# Patient Record
Sex: Female | Born: 1985 | Race: White | Hispanic: No | Marital: Single | Smoking: Smoker, current status unknown
Health system: Southern US, Community
[De-identification: ages and names within clinical notes are randomized; demographics above are authoritative.]

## PROBLEM LIST (undated history)

## (undated) DIAGNOSIS — F329 Major depressive disorder, single episode, unspecified: Secondary | ICD-10-CM

## (undated) DIAGNOSIS — R32 Unspecified urinary incontinence: Secondary | ICD-10-CM

## (undated) DIAGNOSIS — R200 Anesthesia of skin: Secondary | ICD-10-CM

## (undated) DIAGNOSIS — R202 Paresthesia of skin: Secondary | ICD-10-CM

## (undated) DIAGNOSIS — H9319 Tinnitus, unspecified ear: Secondary | ICD-10-CM

## (undated) DIAGNOSIS — G43909 Migraine, unspecified, not intractable, without status migrainosus: Secondary | ICD-10-CM

## (undated) DIAGNOSIS — F32A Depression, unspecified: Secondary | ICD-10-CM

## (undated) DIAGNOSIS — M5440 Lumbago with sciatica, unspecified side: Secondary | ICD-10-CM

## (undated) DIAGNOSIS — F419 Anxiety disorder, unspecified: Secondary | ICD-10-CM

## (undated) HISTORY — PX: BACK SURGERY: SHX140

## (undated) HISTORY — PX: WISDOM TOOTH EXTRACTION: SHX21

## (undated) HISTORY — PX: DILATION AND CURETTAGE OF UTERUS: SHX78

---
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
## 2003-07-20 ENCOUNTER — Emergency Department (HOSPITAL_COMMUNITY): Admission: EM | Admit: 2003-07-20 | Discharge: 2003-07-20 | Payer: Self-pay | Admitting: Emergency Medicine

## 2004-01-12 ENCOUNTER — Inpatient Hospital Stay (HOSPITAL_COMMUNITY): Admission: AD | Admit: 2004-01-12 | Discharge: 2004-01-12 | Payer: Self-pay | Admitting: Family Medicine

## 2004-05-14 ENCOUNTER — Emergency Department (HOSPITAL_COMMUNITY): Admission: EM | Admit: 2004-05-14 | Discharge: 2004-05-14 | Payer: Self-pay | Admitting: *Deleted

## 2005-02-07 ENCOUNTER — Other Ambulatory Visit: Admission: RE | Admit: 2005-02-07 | Discharge: 2005-02-07 | Payer: Self-pay | Admitting: Obstetrics and Gynecology

## 2005-02-23 ENCOUNTER — Ambulatory Visit (HOSPITAL_COMMUNITY): Admission: RE | Admit: 2005-02-23 | Discharge: 2005-02-23 | Payer: Self-pay | Admitting: Obstetrics and Gynecology

## 2005-02-23 ENCOUNTER — Encounter (INDEPENDENT_AMBULATORY_CARE_PROVIDER_SITE_OTHER): Payer: Self-pay | Admitting: *Deleted

## 2007-02-25 ENCOUNTER — Emergency Department (HOSPITAL_COMMUNITY): Admission: EM | Admit: 2007-02-25 | Discharge: 2007-02-26 | Payer: Self-pay | Admitting: Emergency Medicine

## 2007-10-03 ENCOUNTER — Inpatient Hospital Stay (HOSPITAL_COMMUNITY): Admission: AD | Admit: 2007-10-03 | Discharge: 2007-10-05 | Payer: Self-pay | Admitting: Obstetrics and Gynecology

## 2008-09-24 ENCOUNTER — Inpatient Hospital Stay (HOSPITAL_COMMUNITY): Admission: AD | Admit: 2008-09-24 | Discharge: 2008-09-26 | Payer: Self-pay | Admitting: Obstetrics and Gynecology

## 2011-01-02 LAB — CBC
HCT: 32.7 % — ABNORMAL LOW (ref 36.0–46.0)
HCT: 39.5 % (ref 36.0–46.0)
Hemoglobin: 13.2 g/dL (ref 12.0–15.0)
MCHC: 33.4 g/dL (ref 30.0–36.0)
MCV: 88.6 fL (ref 78.0–100.0)
MCV: 88.7 fL (ref 78.0–100.0)
Platelets: 170 10*3/uL (ref 150–400)
Platelets: 225 10*3/uL (ref 150–400)
RBC: 4.46 MIL/uL (ref 3.87–5.11)
RDW: 13.4 % (ref 11.5–15.5)
RDW: 13.7 % (ref 11.5–15.5)
WBC: 16 10*3/uL — ABNORMAL HIGH (ref 4.0–10.5)

## 2011-01-31 NOTE — Discharge Summary (Signed)
Rhonda Key, DIECKMAN              ACCOUNT NO.:  [id number]   MEDICAL RECORD NO.:  [id number]          PATIENT TYPE:  INP   LOCATION:  9107                          FACILITY:  [facility]   PHYSICIAN:  Malachi Pro. Ambrose Mantle, M.D. DATE OF BIRTH:  Jun 11, 1986   DATE OF ADMISSION:  09/24/2008  DATE OF DISCHARGE:  09/26/2008                               DISCHARGE SUMMARY   A 25 year old white female para 1-0-2-1, gravida 4, EDC September 27, 2008, by 10-week ultrasound presenting with contractions every 2-4  minutes and cervix changed to 5 cm.  Prenatal care was relatively  uncomplicated except for positive group B strep and a history of stable  depression, possibly desired tubal ligation.   PRENATAL LABORATORIES:  O positive.  Negative antibody.  RPR  nonreactive.  Rubella immune.  Hepatitis B surface antigen negative.  HIV negative.  GC and chlamydia negative.  Group B strep positive.  One-  hour Glucola 108.  Quad screen negative.   PAST OBSTETRICAL HISTORY:  In 2006, she had a spontaneous abortion.  Also in 2006, an early abortion.  In January 2009, an 8 pounds 5 ounce  vaginal delivery.   GYN HISTORY:  No abnormal Paps.   MEDICAL HISTORY:  History of depression.   SURGICAL HISTORY:  D&E.   ALLERGIES:  None.   MEDICATIONS:  Prenatal vitamins.   PHYSICAL EXAMINATION:  On admission she was afebrile with normal vital  signs.  Heart and lungs were normal.  The abdomen was soft, gravid,  nontender.  Cervix was 4-5 cm, 80% vertex at 0 station.  Artificial  rupture of membranes produced meconium-stained fluid.  The patient was  felt to be in the latent phase of labor and intrauterine pressure  catheter was placed and with augmentation with Pitocin.  No cervical  change in some hours.  The patient was on penicillin for the positive  group B strep status and she was comfortable.  At 4:50 p.m., she had  rapidly progressed to complete dilatation and pushed great with a normal  spontaneous delivery  of vigorous female infant weight 7 pounds 12  ounces, Apgars of 8 and 9 one and five minutes.  Dr. Senaida Ores was in  attendance.  Placenta was delivered spontaneously.  Three-vessel cord  was noted.  Cervix, rectum, and vagina were intact.  Blood loss about  350 mL.  Dr. Senaida Ores had a long discussion with the patient regarding  postpartum tubal ligation as a permanent procedure.  She discussed with  her the options of Mirena, IUD, etc.  She declined postpartum tubal and  would use the Mirena at postpartum visit.  On the first and second  postpartum days, the patient was doing well and on the second postpartum  day was considered a candidate for discharge.  RPR was nonreactive.  Initial hemoglobin 13.2, hematocrit 39.5, white count 16,000, platelet  count 225,000.  Followup hemoglobin 11.1.   FINAL DIAGNOSIS:  Intrauterine pregnancy at 39+ weeks, delivered vertex.   OPERATION:  Spontaneous delivery, vertex.   FINAL CONDITION:  Improved.   INSTRUCTIONS:  Our regular discharge instruction booklet.  Motrin  600 mg  30 tablets 1 every 6 hours as needed for pain given at discharge.      Malachi Pro. Ambrose Mantle, M.D.  Electronically Signed     TFH/MEDQ  D:  09/26/2008  T:  09/26/2008  Job:  [id number]

## 2011-01-31 NOTE — Discharge Summary (Signed)
NAMEARITZA, Rhonda Key              ACCOUNT NO.:  [id number]   MEDICAL RECORD NO.:  [id number]          PATIENT TYPE:  INP   LOCATION:  9119                          FACILITY:  [facility]   PHYSICIAN:  Huel Cote, M.D. DATE OF BIRTH:  [birth year]   DATE OF ADMISSION:  10/03/2007  DATE OF DISCHARGE:  10/05/2007                               DISCHARGE SUMMARY   DISCHARGE DIAGNOSES:  1. Term pregnancy at 39 weeks, delivered.  2. Status post normal spontaneous vaginal delivery.   DISCHARGE MEDICATIONS:  1. Motrin 600 mg p.o. every 6 hours.  2. Percocet 1-2 tablets p.o. every 4 hours p.r.n.   FOLLOW UP:  The patient is to follow up in 6 weeks for her routine  postpartum exam.   HOSPITAL COURSE:  The patient is a 25 year old G3, P0-0-2-0, who was  admitted at 39+ weeks gestation in labor.  Prenatal care had been  complicated by late start and other than that was uneventful.   PAST MEDICAL HISTORY:  None.   PAST SURGICAL HISTORY:  D&C in 2006.   PAST OBSTETRICAL HISTORY:  The patient had a spontaneous miscarriage in  2006 and elective abortion in 2006.   PAST GYNECOLOGICAL HISTORY:  None.   ALLERGIES:  NONE.   MEDICATIONS:  She is only on prenatal vitamins on admission.  She did  have a history of depression, however, was stable off of medications  throughout her pregnancy.   LABORATORY DATA:  Prenatal labs:  O+ antibody negative, RPR nonreactive,  rubella immune, hepatitis B surface antigen negative, HIV negative, GC  negative, chlamydia negative, 1 hour Glucola 124, group B strep  negative.   PHYSICAL EXAMINATION:  VITAL SIGNS:  All within normal limits on  admission.  Fetal heart rate was reactive.  GU:  She had a cervical exam that was 4 cm AV and -2 station.   HOSPITAL COURSE:  She had rupture of membranes performed by Dr. Ambrose Mantle.  No specific fluid was seen.  She continued to progress and reach  complete dilation, pushed well and had a vaginal delivery of a viable  female infant.  Apgars were 8 and 9.  Weight was 8 pounds 5 ounces.  There was some meconium stained fluid which was noted after delivery of  the infant.  She had some lacerations which were repaired with 2-0 and 3-  0 Vicryl, second-degree  left and a right labial laceration.  Estimated blood loss was 500 cc.  She was then admitted for routine postpartum care and did quite well.  On postpartum day number 2, she was tolerating her pain with p.o.  medications and was doing quite well, and was felt stable for discharge  home.      Huel Cote, M.D.  Electronically Signed     KR/MEDQ  D:  10/05/2007  T:  10/05/2007  Job:  [id number]

## 2011-02-03 NOTE — Op Note (Signed)
[name], Rhonda Key              ACCOUNT NO.:  [id number]   MEDICAL RECORD NO.:  [id number]          PATIENT TYPE:  AMB   LOCATION:  SDC                           FACILITY:  [facility]   PHYSICIAN:  Carrington Clamp, M.D. DATE OF BIRTH:  1985/10/04   DATE OF PROCEDURE:  02/23/2005  DATE OF DISCHARGE:                                 OPERATIVE REPORT   PREOPERATIVE DIAGNOSIS:  Missed abortion.   POSTOPERATIVE DIAGNOSIS:  Missed abortion.   PROCEDURE:  Dilatation and curettage with suction.   SURGEON:  Carrington Clamp, M.D.   ASSISTANT:  None.   ANESTHESIA:  MAC.   SPECIMENS:  Uterine contents.   ESTIMATED BLOOD LOSS:  100 mL.   URINE OUTPUT:  Not measured.   FLUIDS REPLACED:   COMPLICATIONS:  None.   FINDINGS:  8 week uterus sounded to 6 weeks size post procedure with good  crie.   MEDICATIONS:  1% lidocaine with epinephrine.  Methergine.   DESCRIPTION OF PROCEDURE:  After adequate MAC anesthesia was achieved, the  patient was prepped and draped in the usual sterile fashion in the dorsal  lithotomy position.  Speculum was placed in the vagina after the bladder had  been drained with a red rubber catheter.  CO2 tenaculum used to stabilize  the cervix.  The cervix was dilated up with [facility] dilators until 9 mm curet  could be passed into the uterine cavity.  Tissue was noted on curettage  and alternating sharp curettage and suction curettage emptied the uterus of  its contents.  Good crie was noted post procedure and the patient was given  Methergine with 1% lidocaine with epinephrine directly to the cervix.  The  patient tolerated the procedure well and returned to the recovery room in  stable condition.       MH/MEDQ  D:  02/23/2005  T:  02/23/2005  Job:  [id number]

## 2011-06-08 LAB — RPR: RPR Ser Ql: NONREACTIVE

## 2011-06-08 LAB — CBC
Platelets: 208
Platelets: 242
RDW: 13.8
WBC: 16 — ABNORMAL HIGH
WBC: 17.2 — ABNORMAL HIGH

## 2011-06-08 LAB — CCBB MATERNAL DONOR DRAW

## 2011-07-06 LAB — POCT PREGNANCY, URINE
Operator id: 27065
Preg Test, Ur: POSITIVE

## 2011-07-06 LAB — URINE MICROSCOPIC-ADD ON

## 2011-07-06 LAB — GC/CHLAMYDIA PROBE AMP, GENITAL
Chlamydia, DNA Probe: NEGATIVE
GC Probe Amp, Genital: NEGATIVE

## 2011-07-06 LAB — URINALYSIS, ROUTINE W REFLEX MICROSCOPIC
Nitrite: NEGATIVE
Protein, ur: NEGATIVE
Urobilinogen, UA: 1

## 2011-09-05 ENCOUNTER — Encounter: Payer: Self-pay | Admitting: *Deleted

## 2011-09-05 ENCOUNTER — Emergency Department (HOSPITAL_COMMUNITY)
Admission: EM | Admit: 2011-09-05 | Discharge: 2011-09-06 | Disposition: A | Discharge status: Home / Self Care | Payer: Self-pay | Attending: Emergency Medicine | Admitting: Emergency Medicine

## 2011-09-05 DIAGNOSIS — R110 Nausea: Secondary | ICD-10-CM | POA: Insufficient documentation

## 2011-09-05 DIAGNOSIS — N949 Unspecified condition associated with female genital organs and menstrual cycle: Secondary | ICD-10-CM | POA: Insufficient documentation

## 2011-09-05 DIAGNOSIS — R5381 Other malaise: Secondary | ICD-10-CM | POA: Insufficient documentation

## 2011-09-05 DIAGNOSIS — N938 Other specified abnormal uterine and vaginal bleeding: Secondary | ICD-10-CM | POA: Insufficient documentation

## 2011-09-05 DIAGNOSIS — F17200 Nicotine dependence, unspecified, uncomplicated: Secondary | ICD-10-CM | POA: Insufficient documentation

## 2011-09-05 DIAGNOSIS — G43909 Migraine, unspecified, not intractable, without status migrainosus: Secondary | ICD-10-CM | POA: Insufficient documentation

## 2011-09-05 DIAGNOSIS — R109 Unspecified abdominal pain: Secondary | ICD-10-CM | POA: Insufficient documentation

## 2011-09-05 DIAGNOSIS — R630 Anorexia: Secondary | ICD-10-CM | POA: Insufficient documentation

## 2011-09-05 LAB — URINALYSIS, ROUTINE W REFLEX MICROSCOPIC
Bilirubin Urine: NEGATIVE
Ketones, ur: NEGATIVE mg/dL
Nitrite: NEGATIVE
Specific Gravity, Urine: 1.014 (ref 1.005–1.030)
Urobilinogen, UA: 0.2 mg/dL (ref 0.0–1.0)

## 2011-09-05 LAB — URINE MICROSCOPIC-ADD ON

## 2011-09-05 NOTE — ED Notes (Signed)
Pt in c/o vaginal bleeding x3 weeks, states she has an IUD and does not normally have a period, also c/o headaches over last year- also abd pain x3 weeks

## 2011-09-05 NOTE — ED Provider Notes (Signed)
History     CSN: [id number] Arrival date & time: 09/05/2011  6:40 PM   First MD Initiated Contact with Patient 09/05/11 2300      Chief Complaint  Patient presents with  . Vaginal Bleeding  . Headache    (Consider location/radiation/quality/duration/timing/severity/associated sxs/prior treatment) HPI Comments: Patient has an IUD in started having vaginal bleeding 3 weeks ago which has gradually worsened to where now she is going through multiple pads and soaking through her clothing. Also gradually worsening pain in the vaginal and lower abdominal area which is worse with sitting and going to the bathroom. It improves with lying down. She denies any shortness of breath, chest pain but states she does feel weak.  Patient is a 25 y.o. female presenting with vaginal bleeding and headaches. The history is provided by the patient.  Vaginal Bleeding This is a new problem. Episode onset: 3 weeks ago. The problem occurs constantly. The problem has been gradually worsening. Associated symptoms include abdominal pain and headaches. Pertinent negatives include no chest pain and no shortness of breath. The symptoms are aggravated by nothing. The symptoms are relieved by nothing. She has tried acetaminophen for the symptoms. The treatment provided no relief.  Headache  This is a chronic problem. Episode onset: One year ago. Episode frequency: Everyday. The problem has been gradually improving. The headache is associated with bright light, activity and emotional stress. The pain is located in the frontal region. The quality of the pain is described as throbbing and dull. The pain is at a severity of 3/10. The pain is mild. The pain does not radiate. Associated symptoms include anorexia and nausea. Pertinent negatives include no fever, no chest pressure, no palpitations, no shortness of breath and no vomiting. She has tried acetaminophen for the symptoms. The treatment provided mild relief.    History  reviewed. No pertinent past medical history.  History reviewed. No pertinent past surgical history.  History reviewed. No pertinent family history.  History  Substance Use Topics  . Smoking status: Current Everyday Smoker  . Smokeless tobacco: Not on file  . Alcohol Use: No    OB History    Grav Para Term Preterm Abortions TAB SAB Ect Mult Living                  Review of Systems  Constitutional: Negative for fever and chills.  Respiratory: Negative for shortness of breath.   Cardiovascular: Negative for chest pain and palpitations.  Gastrointestinal: Positive for nausea, abdominal pain and anorexia. Negative for vomiting.  Genitourinary: Positive for vaginal bleeding.  Neurological: Positive for weakness and headaches. Negative for syncope, speech difficulty and numbness.  All other systems reviewed and are negative.    Allergies  Sulfa antibiotics  Home Medications   Current Outpatient Rx  Name Route Sig Dispense Refill  . ACETAMINOPHEN 500 MG PO TABS Oral Take 1,000 mg by mouth every 4 (four) hours as needed. For pain.       BP 107/64  Pulse 70  Temp(Src) 98.6 F (37 C) (Oral)  Resp 20  SpO2 97%  Physical Exam  Nursing note and vitals reviewed. Constitutional: She is oriented to person, place, and time. She appears well-developed and well-nourished. No distress.  HENT:  Head: Normocephalic and atraumatic.  Mouth/Throat: Oropharynx is clear and moist.  Eyes: Conjunctivae and EOM are normal. Pupils are equal, round, and reactive to light.       Pink conjunctiva  Neck: Normal range of motion. Neck supple.  Cardiovascular: Normal rate, regular rhythm, normal heart sounds and intact distal pulses.  Exam reveals no friction rub.   No murmur heard. Pulmonary/Chest: Effort normal and breath sounds normal. She has no wheezes. She has no rales.  Abdominal: Soft. Bowel sounds are normal. She exhibits no distension. There is no tenderness. There is no rebound and no  guarding.  Genitourinary: Vagina normal and uterus normal. Cervix exhibits no motion tenderness. Right adnexum displays no mass and no tenderness. Left adnexum displays no mass and no tenderness. No tenderness around the vagina. No vaginal discharge found.       Mild vaginal bleeding. The strings of IUD are present.  Musculoskeletal: Normal range of motion. She exhibits no tenderness.       No edema  Lymphadenopathy:    She has no cervical adenopathy.  Neurological: She is alert and oriented to person, place, and time. She has normal strength. No cranial nerve deficit or sensory deficit.  Skin: Skin is warm and dry. No rash noted.  Psychiatric: She has a normal mood and affect. Her behavior is normal.    ED Course  Procedures (including critical care time)  Labs Reviewed  URINALYSIS, ROUTINE W REFLEX MICROSCOPIC - Abnormal; Notable for the following:    Hgb urine dipstick LARGE (*)    All other components within normal limits  WET PREP, GENITAL - Abnormal; Notable for the following:    WBC, Wet Prep HPF POC FEW (*)    All other components within normal limits  URINE MICROSCOPIC-ADD ON  PREGNANCY, URINE  POCT PREGNANCY, URINE  POCT PREGNANCY, URINE  GC/CHLAMYDIA PROBE AMP, GENITAL   No results found.   No diagnosis found.    MDM   Patient with a history of an IUD placed 3 years ago who has had no problems with it until the last 3 weeks. She states while having the IUD she had very minimal bleeding until the last 3 weeks. She has had heavy periods and a lot of pressure and pain in the vaginal area worse with sitting. She feels better when lying down but states the bleeding has persisted and now she is bleeding even heavier going through pads and her clothing. Patient states yesterday she attempted to feel the strings of her IUD and was unable to feel them. She denies any vaginal discharge, new sexual partners, or history of STD. Also she is complaining of mild suprapubic abdominal  pain that is just gradually worsened over the last 3 weeks. No vomiting, fever, diarrhea. Secondly the patient for the last year has had persistent migraines which have become very intense within the last month however in the last 2-3 weeks they've improved since the bleeding started. She has only been taking Tylenol for the migraines and states currently she is not having a headache. She denies any neurologic symptoms or any symptoms suggestive of a space occupying lesion, arachnoid hemorrhage, or other life-threatening pathology.  Will do a pelvic exam to further evaluate patient's initial complaints.  1:19 AM Wet prep negative for signs of infection. UA without signs of infection. UPT negative. Went back to see if patient wanted her IUD out however she states she has to leave to get  her children she says she'll get it worked out when her insurance kicks back in. Will give Provera to see if that improves the bleeding. There were no sign surging for cervical motion tenderness or ovarian abnormalities.      Gwyneth Sprout, MD 09/06/11 [phone]

## 2011-09-06 LAB — WET PREP, GENITAL
Clue Cells Wet Prep HPF POC: NONE SEEN
Trich, Wet Prep: NONE SEEN
Yeast Wet Prep HPF POC: NONE SEEN

## 2011-09-06 LAB — GC/CHLAMYDIA PROBE AMP, GENITAL
Chlamydia, DNA Probe: NEGATIVE
GC Probe Amp, Genital: NEGATIVE

## 2011-09-06 LAB — POCT PREGNANCY, URINE: Preg Test, Ur: NEGATIVE

## 2011-09-06 LAB — PREGNANCY, URINE: Preg Test, Ur: NEGATIVE

## 2011-09-06 MED ORDER — MEDROXYPROGESTERONE ACETATE 5 MG PO TABS: 5.0000 mg | ORAL_TABLET | Freq: Every day | ORAL | Status: DC

## 2011-09-06 NOTE — ED Notes (Signed)
MD at bedside. 

## 2012-10-02 ENCOUNTER — Encounter (HOSPITAL_COMMUNITY): Payer: Self-pay | Admitting: Emergency Medicine

## 2012-10-02 ENCOUNTER — Emergency Department (HOSPITAL_COMMUNITY)
Admission: EM | Admit: 2012-10-02 | Discharge: 2012-10-02 | Disposition: A | Discharge status: Home / Self Care | Payer: Self-pay | Attending: Emergency Medicine | Admitting: Emergency Medicine

## 2012-10-02 DIAGNOSIS — Z79899 Other long term (current) drug therapy: Secondary | ICD-10-CM | POA: Insufficient documentation

## 2012-10-02 DIAGNOSIS — M5430 Sciatica, unspecified side: Secondary | ICD-10-CM | POA: Insufficient documentation

## 2012-10-02 DIAGNOSIS — M545 Low back pain, unspecified: Secondary | ICD-10-CM | POA: Insufficient documentation

## 2012-10-02 DIAGNOSIS — F17200 Nicotine dependence, unspecified, uncomplicated: Secondary | ICD-10-CM | POA: Insufficient documentation

## 2012-10-02 DIAGNOSIS — M79609 Pain in unspecified limb: Secondary | ICD-10-CM | POA: Insufficient documentation

## 2012-10-02 DIAGNOSIS — R52 Pain, unspecified: Secondary | ICD-10-CM | POA: Insufficient documentation

## 2012-10-02 DIAGNOSIS — M5441 Lumbago with sciatica, right side: Secondary | ICD-10-CM

## 2012-10-02 HISTORY — DX: Lumbago with sciatica, unspecified side: M54.40

## 2012-10-02 MED ORDER — PREDNISONE 10 MG PO TABS: 20.0000 mg | ORAL_TABLET | Freq: Every day | ORAL | Status: DC

## 2012-10-02 MED ORDER — METHOCARBAMOL 500 MG PO TABS
500.0000 mg | ORAL_TABLET | Freq: Once | ORAL | Status: AC
  Administered 2012-10-02: 500 mg via ORAL
  Filled 2012-10-02: qty 1

## 2012-10-02 MED ORDER — METHOCARBAMOL 500 MG PO TABS: 500.0000 mg | ORAL_TABLET | Freq: Two times a day (BID) | ORAL | Status: DC

## 2012-10-02 MED ORDER — HYDROCODONE-ACETAMINOPHEN 5-325 MG PO TABS: 1.0000 | ORAL_TABLET | ORAL | Status: DC | PRN

## 2012-10-02 MED ORDER — HYDROCODONE-ACETAMINOPHEN 5-325 MG PO TABS
1.0000 | ORAL_TABLET | Freq: Once | ORAL | Status: AC
  Administered 2012-10-02: 1 via ORAL
  Filled 2012-10-02: qty 1

## 2012-10-02 NOTE — ED Provider Notes (Signed)
Medical screening examination/treatment/procedure(s) were performed by non-physician practitioner and as supervising physician I was immediately available for consultation/collaboration.   Glynn Octave, MD 10/02/12 [phone]
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
## 2012-10-16 ENCOUNTER — Emergency Department (HOSPITAL_COMMUNITY)
Admission: EM | Admit: 2012-10-16 | Discharge: 2012-10-16 | Disposition: A | Discharge status: Home / Self Care | Payer: Self-pay | Attending: Emergency Medicine | Admitting: Emergency Medicine

## 2012-10-16 ENCOUNTER — Encounter (HOSPITAL_COMMUNITY): Payer: Self-pay

## 2012-10-16 ENCOUNTER — Emergency Department (HOSPITAL_COMMUNITY): Payer: Self-pay

## 2012-10-16 DIAGNOSIS — Z8739 Personal history of other diseases of the musculoskeletal system and connective tissue: Secondary | ICD-10-CM | POA: Insufficient documentation

## 2012-10-16 DIAGNOSIS — IMO0002 Reserved for concepts with insufficient information to code with codable children: Secondary | ICD-10-CM | POA: Insufficient documentation

## 2012-10-16 DIAGNOSIS — M5416 Radiculopathy, lumbar region: Secondary | ICD-10-CM

## 2012-10-16 DIAGNOSIS — F17200 Nicotine dependence, unspecified, uncomplicated: Secondary | ICD-10-CM | POA: Insufficient documentation

## 2012-10-16 DIAGNOSIS — M5126 Other intervertebral disc displacement, lumbar region: Secondary | ICD-10-CM | POA: Insufficient documentation

## 2012-10-16 MED ORDER — OXYCODONE-ACETAMINOPHEN 5-325 MG PO TABS
1.0000 | ORAL_TABLET | Freq: Once | ORAL | Status: AC
  Administered 2012-10-16: 1 via ORAL
  Filled 2012-10-16: qty 1

## 2012-10-16 MED ORDER — PREDNISONE 10 MG PO TABS: 60.0000 mg | ORAL_TABLET | Freq: Once | ORAL | Status: DC

## 2012-10-16 MED ORDER — OXYCODONE-ACETAMINOPHEN 5-325 MG PO TABS: 1.0000 | ORAL_TABLET | ORAL | Status: DC | PRN
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
## 2012-10-16 NOTE — ED Notes (Signed)
Patient transported to MRI 

## 2012-10-16 NOTE — ED Notes (Addendum)
Patients urine collected by me.  Patients urine labeled by emt.liza and placed by the bedside.  rn-ericka notifed of urine collection.  Patient checked for confirmation of name and mrn number.

## 2012-10-16 NOTE — ED Notes (Signed)
Patient provided urine sample if needed.

## 2012-10-16 NOTE — ED Notes (Signed)
Pt is concerned about back pain and has been unable to sleep, sit down for more than 5 mins, and walk without extreme pain. Pt stated that pain is constant. Also c/o pelvic pain that feels like its in the bone and tingling to right foot.
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
## 2012-10-20 ENCOUNTER — Emergency Department (HOSPITAL_COMMUNITY)
Admission: EM | Admit: 2012-10-20 | Discharge: 2012-10-20 | Disposition: A | Discharge status: Home / Self Care | Payer: Self-pay | Attending: Emergency Medicine | Admitting: Emergency Medicine

## 2012-10-20 ENCOUNTER — Encounter (HOSPITAL_COMMUNITY): Payer: Self-pay | Admitting: *Deleted

## 2012-10-20 DIAGNOSIS — G8929 Other chronic pain: Secondary | ICD-10-CM | POA: Insufficient documentation

## 2012-10-20 DIAGNOSIS — IMO0002 Reserved for concepts with insufficient information to code with codable children: Secondary | ICD-10-CM | POA: Insufficient documentation

## 2012-10-20 DIAGNOSIS — M5417 Radiculopathy, lumbosacral region: Secondary | ICD-10-CM

## 2012-10-20 DIAGNOSIS — Z791 Long term (current) use of non-steroidal anti-inflammatories (NSAID): Secondary | ICD-10-CM | POA: Insufficient documentation

## 2012-10-20 DIAGNOSIS — F17200 Nicotine dependence, unspecified, uncomplicated: Secondary | ICD-10-CM | POA: Insufficient documentation

## 2012-10-20 DIAGNOSIS — M5431 Sciatica, right side: Secondary | ICD-10-CM

## 2012-10-20 DIAGNOSIS — M5430 Sciatica, unspecified side: Secondary | ICD-10-CM | POA: Insufficient documentation

## 2012-10-20 DIAGNOSIS — Z79899 Other long term (current) drug therapy: Secondary | ICD-10-CM | POA: Insufficient documentation

## 2012-10-20 LAB — BASIC METABOLIC PANEL
BUN: 18 mg/dL (ref 6–23)
Chloride: 99 mEq/L (ref 96–112)
GFR calc Af Amer: >90 mL/min (ref >90)
GFR calc non Af Amer: >90 mL/min (ref >90)
Potassium: 3.8 mEq/L (ref 3.5–5.1)

## 2012-10-20 LAB — CBC WITH DIFFERENTIAL/PLATELET
Basophils Absolute: 0 10*3/uL (ref 0.0–0.1)
Basophils Relative: 0 % (ref 0–1)
Eosinophils Absolute: 0 10*3/uL (ref 0.0–0.7)
Hemoglobin: 14.6 g/dL (ref 12.0–15.0)
MCH: 30.5 pg (ref 26.0–34.0)
MCHC: 34.5 g/dL (ref 30.0–36.0)
Monocytes Relative: 4 % (ref 3–12)
Neutro Abs: 10.8 10*3/uL — ABNORMAL HIGH (ref 1.7–7.7)
Neutrophils Relative %: 88 % — ABNORMAL HIGH (ref 43–77)
Platelets: 245 10*3/uL (ref 150–400)
RDW: 13.1 % (ref 11.5–15.5)

## 2012-10-20 LAB — POCT PREGNANCY, URINE: Preg Test, Ur: NEGATIVE

## 2012-10-20 MED ORDER — HYDROMORPHONE HCL PF 1 MG/ML IJ SOLN
1.0000 mg | Freq: Once | INTRAMUSCULAR | Status: AC
  Administered 2012-10-20: 1 mg via INTRAVENOUS
  Filled 2012-10-20: qty 1

## 2012-10-20 MED ORDER — METHOCARBAMOL 750 MG PO TABS: 750.0000 mg | ORAL_TABLET | Freq: Four times a day (QID) | ORAL | Status: DC

## 2012-10-20 MED ORDER — HYDROMORPHONE HCL 2 MG PO TABS: 2.0000 mg | ORAL_TABLET | ORAL | Status: DC | PRN

## 2012-10-20 MED ORDER — METHOCARBAMOL 100 MG/ML IJ SOLN: 1000.0000 mg | Freq: Once | INTRAMUSCULAR | Status: DC

## 2012-10-20 MED ORDER — ONDANSETRON HCL 4 MG/2ML IJ SOLN
4.0000 mg | Freq: Once | INTRAMUSCULAR | Status: AC
  Administered 2012-10-20: 4 mg via INTRAVENOUS
  Filled 2012-10-20: qty 2

## 2012-10-20 MED ORDER — METHOCARBAMOL 100 MG/ML IJ SOLN
1000.0000 mg | Freq: Once | INTRAVENOUS | Status: AC
  Administered 2012-10-20: 1000 mg via INTRAVENOUS
  Filled 2012-10-20 (×2): qty 10
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
## 2012-10-20 NOTE — ED Notes (Signed)
Iv started pain and nausea med given.  Robaxin 1 gm hung to run in over one hour

## 2012-10-20 NOTE — ED Notes (Signed)
The pt has been crying out almost since she arrived.  She has no tears in her eyes
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
## 2012-10-20 NOTE — ED Provider Notes (Signed)
10:38 AM  Patient signed out to me by Dr. Preston Fleeting. Patient able to ambulate at this point. She will followup with the neurosurgeon tomorrow  Toy Baker, MD 10/20/12 1038

## 2012-10-20 NOTE — ED Notes (Signed)
The pt had a mri when she was seen here 2-3 day

## 2012-10-20 NOTE — ED Notes (Signed)
The pt reports that her back pain increased after she was bathing her child 3 days ago

## 2012-10-21 ENCOUNTER — Other Ambulatory Visit: Payer: Self-pay | Admitting: Neurological Surgery

## 2012-10-21 ENCOUNTER — Encounter (HOSPITAL_COMMUNITY): Payer: Self-pay | Admitting: Pharmacy Technician

## 2012-10-22 ENCOUNTER — Encounter (HOSPITAL_COMMUNITY): Payer: Self-pay

## 2012-10-22 ENCOUNTER — Encounter (HOSPITAL_COMMUNITY)
Admission: RE | Admit: 2012-10-22 | Discharge: 2012-10-22 | Disposition: A | Discharge status: Home / Self Care | Payer: No Typology Code available for payment source | Source: Ambulatory Visit | Attending: Neurological Surgery | Admitting: Neurological Surgery

## 2012-10-22 LAB — CBC
MCH: 30.2 pg (ref 26.0–34.0)
MCHC: 34.4 g/dL (ref 30.0–36.0)
Platelets: 222 10*3/uL (ref 150–400)
RBC: 4.96 MIL/uL (ref 3.87–5.11)

## 2012-10-22 LAB — HCG, SERUM, QUALITATIVE: Preg, Serum: NEGATIVE

## 2012-10-22 LAB — SURGICAL PCR SCREEN: MRSA, PCR: NEGATIVE

## 2012-10-22 NOTE — Progress Notes (Signed)
Called office to have orders released for use spoke with.vannessa.

## 2012-10-22 NOTE — Pre-Procedure Instructions (Addendum)
Rhonda Key  10/22/2012  Your procedure is scheduled on: 10/23/12  Report to [facility] at 1115 AM.  Call this number if you have problems the morning of surgery: [phone]   Remember:   Do not eat food or drink liquids after midnight.   Take these medicines the morning of surgery with A SIP OF WATER: pain meds, prednisone  STOP naproxen now   Do not wear jewelry, make-up or nail polish.  Do not wear lotions, powders, or perfumes. You may not wear deodorant.  Do not shave 48 hours prior to surgery. Men may shave face and neck.  Do not bring valuables to the hospital.  Contacts, dentures or bridgework may not be worn into surgery.  Leave suitcase in the car. After surgery it may be brought to your room.  For patients admitted to the hospital, checkout time is 11:00 AM the day of  discharge.   Patients discharged the day of surgery will not be allowed to drive  home.  Name and phone number of your driver:   Special Instructions: Shower using CHG 2 nights before surgery and the night before surgery.  If you shower the day of surgery use CHG.  Use special wash - you have one bottle of CHG for all showers.  You should use approximately 1/3 of the bottle for each shower.   Please read over the following fact sheets that you were given: Pain Booklet, Coughing and Deep Breathing, MRSA Information and Surgical Site Infection Prevention

## 2012-10-23 ENCOUNTER — Ambulatory Visit (HOSPITAL_COMMUNITY)
Admission: RE | Admit: 2012-10-23 | Discharge: 2012-10-24 | Disposition: A | Discharge status: Home / Self Care | Payer: No Typology Code available for payment source | Source: Ambulatory Visit | Attending: Neurological Surgery | Admitting: Neurological Surgery

## 2012-10-23 ENCOUNTER — Encounter (HOSPITAL_COMMUNITY): Payer: Self-pay | Admitting: Anesthesiology

## 2012-10-23 ENCOUNTER — Encounter (HOSPITAL_COMMUNITY): Payer: Self-pay

## 2012-10-23 ENCOUNTER — Encounter (HOSPITAL_COMMUNITY)
Admission: RE | Disposition: A | Discharge status: Home / Self Care | Payer: Self-pay | Source: Ambulatory Visit | Attending: Neurological Surgery

## 2012-10-23 ENCOUNTER — Ambulatory Visit (HOSPITAL_COMMUNITY): Payer: No Typology Code available for payment source

## 2012-10-23 ENCOUNTER — Ambulatory Visit (HOSPITAL_COMMUNITY): Payer: No Typology Code available for payment source | Admitting: Anesthesiology

## 2012-10-23 DIAGNOSIS — Z01812 Encounter for preprocedural laboratory examination: Secondary | ICD-10-CM | POA: Insufficient documentation

## 2012-10-23 DIAGNOSIS — F17200 Nicotine dependence, unspecified, uncomplicated: Secondary | ICD-10-CM | POA: Insufficient documentation

## 2012-10-23 DIAGNOSIS — M5126 Other intervertebral disc displacement, lumbar region: Secondary | ICD-10-CM | POA: Insufficient documentation

## 2012-10-23 DIAGNOSIS — Z9889 Other specified postprocedural states: Secondary | ICD-10-CM

## 2012-10-23 HISTORY — PX: LUMBAR LAMINECTOMY/DECOMPRESSION MICRODISCECTOMY: SHX5026

## 2012-10-23 SURGERY — LUMBAR LAMINECTOMY/DECOMPRESSION MICRODISCECTOMY 1 LEVEL
Anesthesia: General | Site: Spine Lumbar | Laterality: Right | Wound class: Clean

## 2012-10-23 MED ORDER — ONDANSETRON HCL 4 MG/2ML IJ SOLN
INTRAMUSCULAR | Status: DC | PRN
  Administered 2012-10-23: 4 mg via INTRAVENOUS

## 2012-10-23 MED ORDER — SODIUM CHLORIDE 0.9 % IV SOLN: 250.0000 mL | INTRAVENOUS | Status: DC

## 2012-10-23 MED ORDER — OXYCODONE HCL 5 MG PO TABS
ORAL_TABLET | ORAL | Status: AC
  Filled 2012-10-23: qty 1

## 2012-10-23 MED ORDER — SODIUM CHLORIDE 0.9 % IR SOLN
Status: DC | PRN
  Administered 2012-10-23: 15:00:00

## 2012-10-23 MED ORDER — HYDROMORPHONE HCL PF 1 MG/ML IJ SOLN
INTRAMUSCULAR | Status: AC
  Filled 2012-10-23: qty 1

## 2012-10-23 MED ORDER — ACETAMINOPHEN 650 MG RE SUPP: 650.0000 mg | RECTAL | Status: DC | PRN

## 2012-10-23 MED ORDER — OXYCODONE HCL 5 MG/5ML PO SOLN: 5.0000 mg | Freq: Once | ORAL | Status: AC | PRN

## 2012-10-23 MED ORDER — DEXAMETHASONE 4 MG PO TABS
4.0000 mg | ORAL_TABLET | Freq: Four times a day (QID) | ORAL | Status: DC
  Administered 2012-10-24: 4 mg via ORAL
  Filled 2012-10-23 (×7): qty 1

## 2012-10-23 MED ORDER — MENTHOL 3 MG MT LOZG: 1.0000 | LOZENGE | OROMUCOSAL | Status: DC | PRN

## 2012-10-23 MED ORDER — LACTATED RINGERS IV SOLN
INTRAVENOUS | Status: DC | PRN
  Administered 2012-10-23: 13:00:00 via INTRAVENOUS

## 2012-10-23 MED ORDER — CEFAZOLIN SODIUM 1-5 GM-% IV SOLN
1.0000 g | Freq: Three times a day (TID) | INTRAVENOUS | Status: AC
  Administered 2012-10-23 – 2012-10-24 (×2): 1 g via INTRAVENOUS
  Filled 2012-10-23 (×3): qty 50

## 2012-10-23 MED ORDER — PHENOL 1.4 % MT LIQD: 1.0000 | OROMUCOSAL | Status: DC | PRN

## 2012-10-23 MED ORDER — DEXAMETHASONE SODIUM PHOSPHATE 4 MG/ML IJ SOLN
INTRAMUSCULAR | Status: DC | PRN
  Administered 2012-10-23: 8 mg via INTRAVENOUS

## 2012-10-23 MED ORDER — LIDOCAINE HCL (CARDIAC) 20 MG/ML IV SOLN
INTRAVENOUS | Status: DC | PRN
  Administered 2012-10-23: 100 mg via INTRAVENOUS

## 2012-10-23 MED ORDER — ACETAMINOPHEN 325 MG PO TABS: 650.0000 mg | ORAL_TABLET | ORAL | Status: DC | PRN

## 2012-10-23 MED ORDER — CEFAZOLIN SODIUM-DEXTROSE 2-3 GM-% IV SOLR
INTRAVENOUS | Status: AC
  Administered 2012-10-23: 2 g via INTRAVENOUS
  Filled 2012-10-23: qty 50

## 2012-10-23 MED ORDER — THROMBIN 5000 UNITS EX KIT
PACK | CUTANEOUS | Status: DC | PRN
  Administered 2012-10-23 (×2): 5000 [IU] via TOPICAL

## 2012-10-23 MED ORDER — OXYCODONE-ACETAMINOPHEN 5-325 MG PO TABS
1.0000 | ORAL_TABLET | ORAL | Status: DC | PRN
  Administered 2012-10-23: 1 via ORAL
  Administered 2012-10-23: 2 via ORAL
  Filled 2012-10-23: qty 1
  Filled 2012-10-23: qty 2

## 2012-10-23 MED ORDER — BACITRACIN 50000 UNITS IM SOLR
INTRAMUSCULAR | Status: AC
  Filled 2012-10-23: qty 1

## 2012-10-23 MED ORDER — SODIUM CHLORIDE 0.9 % IJ SOLN: 3.0000 mL | Freq: Two times a day (BID) | INTRAMUSCULAR | Status: DC

## 2012-10-23 MED ORDER — POTASSIUM CHLORIDE IN NACL 20-0.9 MEQ/L-% IV SOLN
INTRAVENOUS | Status: DC
  Administered 2012-10-23: 18:00:00 via INTRAVENOUS
  Filled 2012-10-23 (×2): qty 1000

## 2012-10-23 MED ORDER — FENTANYL CITRATE 0.05 MG/ML IJ SOLN
INTRAMUSCULAR | Status: DC | PRN
  Administered 2012-10-23: 100 ug via INTRAVENOUS

## 2012-10-23 MED ORDER — FENTANYL CITRATE 0.05 MG/ML IJ SOLN
INTRAMUSCULAR | Status: DC | PRN
  Administered 2012-10-23 (×2): 75 ug via INTRAVENOUS
  Administered 2012-10-23 (×2): 50 ug via INTRAVENOUS

## 2012-10-23 MED ORDER — 0.9 % SODIUM CHLORIDE (POUR BTL) OPTIME
TOPICAL | Status: DC | PRN
  Administered 2012-10-23: 1000 mL

## 2012-10-23 MED ORDER — SODIUM CHLORIDE 0.9 % IJ SOLN: 3.0000 mL | INTRAMUSCULAR | Status: DC | PRN

## 2012-10-23 MED ORDER — FENTANYL CITRATE 0.05 MG/ML IJ SOLN
INTRAMUSCULAR | Status: AC
  Filled 2012-10-23: qty 2

## 2012-10-23 MED ORDER — MIDAZOLAM HCL 5 MG/5ML IJ SOLN
INTRAMUSCULAR | Status: DC | PRN
  Administered 2012-10-23: 2 mg via INTRAVENOUS

## 2012-10-23 MED ORDER — HEMOSTATIC AGENTS (NO CHARGE) OPTIME
TOPICAL | Status: DC | PRN
  Administered 2012-10-23: 1 via TOPICAL

## 2012-10-23 MED ORDER — SODIUM CHLORIDE 0.9 % IV SOLN
INTRAVENOUS | Status: AC
  Filled 2012-10-23: qty 500

## 2012-10-23 MED ORDER — ONDANSETRON HCL 4 MG/2ML IJ SOLN: 4.0000 mg | INTRAMUSCULAR | Status: DC | PRN

## 2012-10-23 MED ORDER — POLYETHYLENE GLYCOL 3350 17 G PO PACK
17.0000 g | PACK | Freq: Every day | ORAL | Status: DC
  Administered 2012-10-23: 17 g via ORAL
  Filled 2012-10-23 (×3): qty 1

## 2012-10-23 MED ORDER — HYDROMORPHONE HCL PF 1 MG/ML IJ SOLN
0.2500 mg | INTRAMUSCULAR | Status: DC | PRN
  Administered 2012-10-23 (×4): 0.5 mg via INTRAVENOUS

## 2012-10-23 MED ORDER — METHOCARBAMOL 100 MG/ML IJ SOLN: 500.0000 mg | Freq: Four times a day (QID) | INTRAVENOUS | Status: DC | PRN

## 2012-10-23 MED ORDER — METHYLPREDNISOLONE ACETATE 80 MG/ML IJ SUSP
INTRAMUSCULAR | Status: DC | PRN
  Administered 2012-10-23: 80 mg

## 2012-10-23 MED ORDER — NEOSTIGMINE METHYLSULFATE 1 MG/ML IJ SOLN
INTRAMUSCULAR | Status: DC | PRN
  Administered 2012-10-23: 3 mg via INTRAVENOUS

## 2012-10-23 MED ORDER — PROPOFOL 10 MG/ML IV BOLUS
INTRAVENOUS | Status: DC | PRN
  Administered 2012-10-23: 200 mg via INTRAVENOUS

## 2012-10-23 MED ORDER — BUPIVACAINE HCL (PF) 0.25 % IJ SOLN
INTRAMUSCULAR | Status: DC | PRN
  Administered 2012-10-23: 2 mL

## 2012-10-23 MED ORDER — GLYCOPYRROLATE 0.2 MG/ML IJ SOLN
INTRAMUSCULAR | Status: DC | PRN
  Administered 2012-10-23: 0.4 mg via INTRAVENOUS

## 2012-10-23 MED ORDER — METHOCARBAMOL 500 MG PO TABS
500.0000 mg | ORAL_TABLET | Freq: Four times a day (QID) | ORAL | Status: DC | PRN
  Administered 2012-10-24: 500 mg via ORAL
  Filled 2012-10-23: qty 1

## 2012-10-23 MED ORDER — METOCLOPRAMIDE HCL 5 MG/ML IJ SOLN: 10.0000 mg | Freq: Once | INTRAMUSCULAR | Status: DC | PRN

## 2012-10-23 MED ORDER — DEXAMETHASONE SODIUM PHOSPHATE 4 MG/ML IJ SOLN
4.0000 mg | Freq: Four times a day (QID) | INTRAMUSCULAR | Status: DC
  Administered 2012-10-23 (×2): 4 mg via INTRAVENOUS
  Filled 2012-10-23 (×6): qty 1

## 2012-10-23 MED ORDER — OXYCODONE HCL 5 MG PO TABS
5.0000 mg | ORAL_TABLET | Freq: Once | ORAL | Status: AC | PRN
  Administered 2012-10-23: 5 mg via ORAL

## 2012-10-23 MED ORDER — MORPHINE SULFATE 2 MG/ML IJ SOLN: 1.0000 mg | INTRAMUSCULAR | Status: DC | PRN

## 2012-10-23 MED ORDER — ROCURONIUM BROMIDE 100 MG/10ML IV SOLN
INTRAVENOUS | Status: DC | PRN
  Administered 2012-10-23: 10 mg via INTRAVENOUS
  Administered 2012-10-23: 50 mg via INTRAVENOUS

## 2012-10-23 SURGICAL SUPPLY — 51 items
ADH SKN CLS LQ APL DERMABOND (GAUZE/BANDAGES/DRESSINGS) ×1
APL SKNCLS STERI-STRIP NONHPOA (GAUZE/BANDAGES/DRESSINGS)
BAG DECANTER FOR FLEXI CONT (MISCELLANEOUS) ×2 IMPLANT
BENZOIN TINCTURE PRP APPL 2/3 (GAUZE/BANDAGES/DRESSINGS) ×1 IMPLANT
BUR MATCHSTICK NEURO 3.0 LAGG (BURR) ×2 IMPLANT
CANISTER SUCTION 2500CC (MISCELLANEOUS) ×2 IMPLANT
CLOTH BEACON ORANGE TIMEOUT ST (SAFETY) ×2 IMPLANT
CONT SPEC 4OZ CLIKSEAL STRL BL (MISCELLANEOUS) ×2 IMPLANT
DERMABOND ADHESIVE PROPEN (GAUZE/BANDAGES/DRESSINGS) ×1
DERMABOND ADVANCED .7 DNX6 (GAUZE/BANDAGES/DRESSINGS) IMPLANT
DRAPE LAPAROTOMY 100X72X124 (DRAPES) ×2 IMPLANT
DRAPE MICROSCOPE ZEISS OPMI (DRAPES) ×2 IMPLANT
DRAPE POUCH INSTRU U-SHP 10X18 (DRAPES) ×2 IMPLANT
DRAPE SURG 17X23 STRL (DRAPES) ×2 IMPLANT
DRESSING TELFA 8X3 (GAUZE/BANDAGES/DRESSINGS) ×1 IMPLANT
DRSG OPSITE 4X5.5 SM (GAUZE/BANDAGES/DRESSINGS) ×1 IMPLANT
DURAPREP 26ML APPLICATOR (WOUND CARE) ×2 IMPLANT
ELECT REM PT RETURN 9FT ADLT (ELECTROSURGICAL) ×2
ELECTRODE REM PT RTRN 9FT ADLT (ELECTROSURGICAL) ×1 IMPLANT
GAUZE SPONGE 4X4 16PLY XRAY LF (GAUZE/BANDAGES/DRESSINGS) IMPLANT
GLOVE BIO SURGEON STRL SZ8 (GLOVE) ×2 IMPLANT
GLOVE BIOGEL PI IND STRL 8.5 (GLOVE) IMPLANT
GLOVE BIOGEL PI INDICATOR 8.5 (GLOVE) ×1
GLOVE SURG SS PI 8.0 STRL IVOR (GLOVE) ×2 IMPLANT
GOWN BRE IMP SLV AUR LG STRL (GOWN DISPOSABLE) IMPLANT
GOWN BRE IMP SLV AUR XL STRL (GOWN DISPOSABLE) ×2 IMPLANT
GOWN STRL REIN 2XL LVL4 (GOWN DISPOSABLE) ×1 IMPLANT
HEMOSTAT POWDER KIT SURGIFOAM (HEMOSTASIS) IMPLANT
KIT BASIN OR (CUSTOM PROCEDURE TRAY) ×2 IMPLANT
KIT ROOM TURNOVER OR (KITS) ×2 IMPLANT
NDL HYPO 18GX1.5 BLUNT FILL (NEEDLE) IMPLANT
NDL HYPO 25X1 1.5 SAFETY (NEEDLE) ×1 IMPLANT
NDL SPNL 20GX3.5 QUINCKE YW (NEEDLE) IMPLANT
NEEDLE HYPO 18GX1.5 BLUNT FILL (NEEDLE) ×2 IMPLANT
NEEDLE HYPO 25X1 1.5 SAFETY (NEEDLE) ×2 IMPLANT
NEEDLE SPNL 20GX3.5 QUINCKE YW (NEEDLE) IMPLANT
NS IRRIG 1000ML POUR BTL (IV SOLUTION) ×2 IMPLANT
PACK LAMINECTOMY NEURO (CUSTOM PROCEDURE TRAY) ×2 IMPLANT
PAD ARMBOARD 7.5X6 YLW CONV (MISCELLANEOUS) ×8 IMPLANT
RUBBERBAND STERILE (MISCELLANEOUS) ×4 IMPLANT
SPONGE SURGIFOAM ABS GEL SZ50 (HEMOSTASIS) ×2 IMPLANT
STRIP CLOSURE SKIN 1/2X4 (GAUZE/BANDAGES/DRESSINGS) ×1 IMPLANT
SUT VIC AB 0 CT1 18XCR BRD8 (SUTURE) ×1 IMPLANT
SUT VIC AB 0 CT1 8-18 (SUTURE) ×2
SUT VIC AB 2-0 CP2 18 (SUTURE) ×2 IMPLANT
SUT VIC AB 3-0 SH 8-18 (SUTURE) ×2 IMPLANT
SYR 20ML ECCENTRIC (SYRINGE) ×2 IMPLANT
SYR 5ML LL (SYRINGE) ×1 IMPLANT
TOWEL OR 17X24 6PK STRL BLUE (TOWEL DISPOSABLE) ×2 IMPLANT
TOWEL OR 17X26 10 PK STRL BLUE (TOWEL DISPOSABLE) ×2 IMPLANT
WATER STERILE IRR 1000ML POUR (IV SOLUTION) ×2 IMPLANT

## 2012-10-23 NOTE — Transfer of Care (Signed)
Immediate Anesthesia Transfer of Care Note  Patient: Rhonda Key  Procedure(s) Performed: Procedure(s) (LRB) with comments: LUMBAR LAMINECTOMY/DECOMPRESSION MICRODISCECTOMY 1 LEVEL (Right) - Right Lumbar five-Sacral one microdiscectomy  Patient Location: PACU  Anesthesia Type:General  Level of Consciousness: awake, alert  and oriented  Airway & Oxygen Therapy: Patient Spontanous Breathing and Patient connected to nasal cannula oxygen  Post-op Assessment: Report given to PACU RN, Post -op Vital signs reviewed and stable and Patient moving all extremities X 4  Post vital signs: Reviewed and stable  Complications: No apparent anesthesia complications

## 2012-10-23 NOTE — Anesthesia Procedure Notes (Signed)
Procedure Name: Intubation Date/Time: 10/23/2012 1:50 PM Performed by: Sharlene Dory E Pre-anesthesia Checklist: Patient identified, Emergency Drugs available, Suction available, Patient being monitored and Timeout performed Patient Re-evaluated:Patient Re-evaluated prior to inductionOxygen Delivery Method: Circle system utilized Preoxygenation: Pre-oxygenation with 100% oxygen Intubation Type: IV induction Ventilation: Mask ventilation without difficulty Laryngoscope Size: Mac and 3 Grade View: Grade I Tube size: 7.0 mm Number of attempts: 1 Airway Equipment and Method: Stylet Placement Confirmation: ETT inserted through vocal cords under direct vision,  positive ETCO2 and breath sounds checked- equal and bilateral Secured at: 21 cm Tube secured with: Tape Dental Injury: Teeth and Oropharynx as per pre-operative assessment

## 2012-10-23 NOTE — Anesthesia Preprocedure Evaluation (Addendum)
Anesthesia Evaluation  Patient identified by MRN, date of birth, ID band Patient awake    Reviewed: Allergy & Precautions, H&P , NPO status , Patient's Chart, lab work & pertinent test results, reviewed documented beta blocker date and time   Airway Mallampati: II TM Distance: >3 FB Neck ROM: full    Dental  (+) Teeth Intact and Chipped   Pulmonary neg pulmonary ROS,  breath sounds clear to auscultation        Cardiovascular negative cardio ROS  Rhythm:regular     Neuro/Psych negative neurological ROS  negative psych ROS   GI/Hepatic negative GI ROS, Neg liver ROS,   Endo/Other  negative endocrine ROS  Renal/GU negative Renal ROS  negative genitourinary   Musculoskeletal   Abdominal   Peds  Hematology negative hematology ROS (+)   Anesthesia Other Findings See surgeon's H&P   Reproductive/Obstetrics negative OB ROS                          Anesthesia Physical Anesthesia Plan  ASA: I  Anesthesia Plan: General   Post-op Pain Management:    Induction: Intravenous  Airway Management Planned: Oral ETT  Additional Equipment:   Intra-op Plan:   Post-operative Plan: Extubation in OR  Informed Consent: I have reviewed the patients History and Physical, chart, labs and discussed the procedure including the risks, benefits and alternatives for the proposed anesthesia with the patient or authorized representative who has indicated his/her understanding and acceptance.   Dental Advisory Given  Plan Discussed with: CRNA and Surgeon  Anesthesia Plan Comments:         Anesthesia Quick Evaluation

## 2012-10-23 NOTE — H&P (Signed)
Subjective: Patient is a 27 y.o. female admitted for L5-S1 microdiskectomy. Onset of symptoms was a few weeks ago, rapidly worsening since that time.  The pain is rated intense, and is located at the across the lower back and radiates to RLE. The pain is described as aching, sharp and throbbing and occurs all day. The symptoms have been progressive. Symptoms are exacerbated by standing. MRI or CT showed HNP L5-S1.   Past Medical History  Diagnosis Date  . Acute back pain with sciatica     No past surgical history on file.  Prior to Admission medications   Medication Sig Start Date End Date Taking? Authorizing Provider  HYDROmorphone (DILAUDID) 2 MG tablet Take 1 tablet (2 mg total) by mouth every 4 (four) hours as needed for pain. 10/20/12  Yes Toy Baker, MD  Menthol, Topical Analgesic, (BIOFREEZE EX) Apply 1 application topically daily as needed. For muscle pain   Yes Historical Provider, MD  methocarbamol (ROBAXIN-750) 750 MG tablet Take 1 tablet (750 mg total) by mouth 4 (four) times daily. 10/20/12  Yes Toy Baker, MD  naproxen sodium (ANAPROX) 220 MG tablet Take 440 mg by mouth every 8 (eight) hours as needed. For pain    Historical Provider, MD  oxyCODONE-acetaminophen (PERCOCET/ROXICET) 5-325 MG per tablet Take 1 tablet by mouth every 4 (four) hours as needed for pain. 10/16/12   Flint Melter, MD  predniSONE (DELTASONE) 10 MG tablet Take 10-60 mg by mouth See admin instructions. Patient stated the following: 6 tabs x 1 day, 5 tabs x 1 day, 4 tabs x 1 day, 3 tabs x 1 day, 2 tabs x 1 day, 1 tab for x 1 day, then stop. 10/16/12   Flint Melter, MD   No Known Allergies  History  Substance Use Topics  . Smoking status: Current Every Day Smoker -- 0.5 packs/day for 5 years    Types: Cigarettes  . Smokeless tobacco: Not on file  . Alcohol Use: No    No family history on file.   Review of Systems  Positive ROS: neg  All other systems have been reviewed and were otherwise  negative with the exception of those mentioned in the HPI and as above.  Objective: Vital signs in last 24 hours: Temp:  [99.5 F (37.5 C)] 99.5 F (37.5 C) (02/04 1117) Pulse Rate:  [98] 98  (02/04 1117) Resp:  [20] 20  (02/04 1117) BP: (114)/(78) 114/78 mmHg (02/04 1117) SpO2:  [94 %] 94 % (02/04 1117) Weight:  [73.619 kg (162 lb 4.8 oz)] 73.619 kg (162 lb 4.8 oz) (02/04 1117)  General Appearance: Alert, cooperative, no distress, appears stated age Head: Normocephalic, without obvious abnormality, atraumatic Eyes: PERRL, conjunctiva/corneas clear, EOM's intact, fundi benign, both eyes      Ears: Normal TM's and external ear canals, both ears Throat: Lips, mucosa, and tongue normal; teeth and gums normal Neck: Supple, symmetrical, trachea midline, no adenopathy; thyroid: No enlargement/tenderness/nodules; no carotid bruit or JVD Back: Symmetric, no curvature, ROM normal, no CVA tenderness Lungs: Clear to auscultation bilaterally, respirations unlabored Heart: Regular rate and rhythm, S1 and S2 normal, no murmur, rub or gallop Abdomen: Soft, non-tender, bowel sounds active all four quadrants, no masses, no organomegaly Extremities: Extremities normal, atraumatic, no cyanosis or edema Pulses: 2+ and symmetric all extremities Skin: Skin color, texture, turgor normal, no rashes or lesions  NEUROLOGIC:   Mental status: Alert and oriented x4,  no aphasia, good attention span, fund of knowledge, and  memory Motor Exam - grossly normal Sensory Exam - grossly normal Reflexes: 1+, with decreased heel jerk Coordination - grossly normal Gait - antalgic with limp Balance - grossly normal Cranial Nerves: I: smell Not tested  II: visual acuity  OS: nl    OD: nl  II: visual fields Full to confrontation  II: pupils Equal, round, reactive to light  III,VII: ptosis None  III,IV,VI: extraocular muscles  Full ROM  V: mastication Normal  V: facial light touch sensation  Normal  V,VII: corneal  reflex  Present  VII: facial muscle function - upper  Normal  VII: facial muscle function - lower Normal  VIII: hearing Not tested  IX: soft palate elevation  Normal  IX,X: gag reflex Present  XI: trapezius strength  5/5  XI: sternocleidomastoid strength 5/5  XI: neck flexion strength  5/5  XII: tongue strength  Normal    Data Review Lab Results  Component Value Date   WBC 14.7* 10/22/2012   HGB 15.0 10/22/2012   HCT 43.6 10/22/2012   MCV 87.9 10/22/2012   PLT 222 10/22/2012   Lab Results  Component Value Date   NA 138 10/20/2012   K 3.8 10/20/2012   CL 99 10/20/2012   CO2 26 10/20/2012   BUN 18 10/20/2012   CREATININE 0.63 10/20/2012   GLUCOSE 112* 10/20/2012   No results found for this basename: INR, PROTIME    Assessment/Plan: Patient admitted for Microdiskectomy L5-S1. Patient has failed conservative therapy.  I explained the condition and procedure to the patient and answered any questions.  Patient wishes to proceed with procedure as planned. Understands risks/ benefits and typical outcomes of procedure.   Rhonda Key S 10/23/2012 10:26 AM

## 2012-10-23 NOTE — Op Note (Signed)
10/23/2012  2:52 PM  PATIENT:  Rhonda Key  27 y.o. 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 Franky Macho  ANESTHESIA:   General  EBL: 25 ml     BLOOD ADMINISTERED:none  DRAINS: None   SPECIMEN:  No Specimen  INDICATION FOR PROCEDURE: This patient presented with severe right leg pain in an S1 distribution. MRI showed a very large disc herniation at L5-S1 on the right. She failed medical management. I recommended a microdiscectomy at L5-S1 on the right. Patient understood the risks, benefits, and alternatives and potential outcomes and wished to proceed.  PROCEDURE DETAILS: The patient was taken to the operating room and after induction of adequate generalized endotracheal anesthesia, the patient was rolled into the prone position on the Wilson frame and all pressure points were padded. The lumbar region was cleaned and then prepped with DuraPrep and draped in the usual sterile fashion. 5 cc of local anesthesia was injected and then a dorsal midline incision was made and carried down to the lumbo sacral fascia. The fascia was opened and the paraspinous musculature was taken down in a subperiosteal fashion to expose L5-S1 on the right. Intraoperative x-ray confirmed my level, and then I used a combination of the high-speed drill and the Kerrison punches to perform a hemilaminectomy, medial facetectomy, and foraminotomy at L5-S1 on the right. The underlying yellow ligament was opened and removed in a piecemeal fashion to expose the underlying dura and exiting nerve root. I undercut the lateral recess and dissected down until I was medial to and distal to the pedicle. The nerve root was well  decompressed. We then gently retracted the nerve root medially with a retractor, coagulated the epidural venous vasculature, and incised the disc space. A performed a thorough intradiscal discectomy with pituitary rongeurs and curettes, until I had a nice decompression of the nerve root and the midline. I then palpated with a coronary dilator along the nerve root and into the foramen to assure adequate decompression. I felt no more compression of the nerve root. I irrigated with saline solution containing bacitracin. Achieved hemostasis with bipolar cautery, lined the dura with Gelfoam, and then closed the fascia with 0 Vicryl. I closed the subcutaneous tissues with 2-0 Vicryl and the subcuticular tissues with 3-0 Vicryl. The skin was then closed with benzoin and Steri-Strips. The drapes were removed, a sterile dressing was applied. The patient was awakened from general anesthesia and transferred to the recovery room in stable condition. At the end of the procedure all sponge, needle and instrument counts were correct.   PLAN OF CARE: Admit for overnight observation  PATIENT DISPOSITION:  PACU - hemodynamically stable.   Delay start of Pharmacological VTE agent (>24hrs) due to surgical blood loss or risk of bleeding:  yes

## 2012-10-23 NOTE — Anesthesia Postprocedure Evaluation (Signed)
Anesthesia Post Note  Patient: Rhonda Key  Procedure(s) Performed: Procedure(s) (LRB): LUMBAR LAMINECTOMY/DECOMPRESSION MICRODISCECTOMY 1 LEVEL (Right)  Anesthesia type: general  Patient location: PACU  Post pain: Pain level controlled  Post assessment: Patient's Cardiovascular Status Stable  Last Vitals:  Filed Vitals:   10/23/12 1502  BP: 106/72  Pulse: 61  Temp:   Resp: 19    Post vital signs: Reviewed and stable  Level of consciousness: sedated  Complications: No apparent anesthesia complications

## 2012-10-23 NOTE — Preoperative (Signed)
Beta Blockers   Reason not to administer Beta Blockers:Not Applicable 

## 2012-10-24 MED ORDER — OXYCODONE-ACETAMINOPHEN 5-325 MG PO TABS: 1.0000 | ORAL_TABLET | Freq: Four times a day (QID) | ORAL | Status: DC | PRN
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
## 2012-10-25 ENCOUNTER — Encounter (HOSPITAL_COMMUNITY): Payer: Self-pay | Admitting: Neurological Surgery

## 2012-11-25 ENCOUNTER — Emergency Department (HOSPITAL_COMMUNITY): Payer: No Typology Code available for payment source

## 2012-11-25 ENCOUNTER — Emergency Department (HOSPITAL_COMMUNITY)
Admission: EM | Admit: 2012-11-25 | Discharge: 2012-11-25 | Disposition: A | Discharge status: Home / Self Care | Payer: No Typology Code available for payment source | Attending: Emergency Medicine | Admitting: Emergency Medicine

## 2012-11-25 DIAGNOSIS — Y92410 Unspecified street and highway as the place of occurrence of the external cause: Secondary | ICD-10-CM | POA: Insufficient documentation

## 2012-11-25 DIAGNOSIS — S0990XA Unspecified injury of head, initial encounter: Secondary | ICD-10-CM | POA: Insufficient documentation

## 2012-11-25 DIAGNOSIS — Y9389 Activity, other specified: Secondary | ICD-10-CM | POA: Insufficient documentation

## 2012-11-25 DIAGNOSIS — Z8739 Personal history of other diseases of the musculoskeletal system and connective tissue: Secondary | ICD-10-CM | POA: Insufficient documentation

## 2012-11-25 DIAGNOSIS — Z981 Arthrodesis status: Secondary | ICD-10-CM | POA: Insufficient documentation

## 2012-11-25 DIAGNOSIS — M542 Cervicalgia: Secondary | ICD-10-CM

## 2012-11-25 DIAGNOSIS — F17200 Nicotine dependence, unspecified, uncomplicated: Secondary | ICD-10-CM | POA: Insufficient documentation

## 2012-11-25 DIAGNOSIS — S0993XA Unspecified injury of face, initial encounter: Secondary | ICD-10-CM | POA: Insufficient documentation

## 2012-11-25 DIAGNOSIS — R209 Unspecified disturbances of skin sensation: Secondary | ICD-10-CM | POA: Insufficient documentation

## 2012-11-25 DIAGNOSIS — Z9889 Other specified postprocedural states: Secondary | ICD-10-CM | POA: Insufficient documentation

## 2012-11-25 DIAGNOSIS — IMO0002 Reserved for concepts with insufficient information to code with codable children: Secondary | ICD-10-CM | POA: Insufficient documentation

## 2012-11-25 DIAGNOSIS — M549 Dorsalgia, unspecified: Secondary | ICD-10-CM

## 2012-11-25 MED ORDER — IBUPROFEN 800 MG PO TABS
800.0000 mg | ORAL_TABLET | Freq: Once | ORAL | Status: AC
  Administered 2012-11-25: 800 mg via ORAL
  Filled 2012-11-25: qty 1
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
## 2012-11-25 NOTE — ED Provider Notes (Signed)
Medical screening examination/treatment/procedure(s) were conducted as a shared visit with non-physician practitioner(s) and myself.  I personally evaluated the patient during the encounter  Anthony T Allen, MD 11/25/12 2326 
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
## 2012-12-11 ENCOUNTER — Other Ambulatory Visit (HOSPITAL_COMMUNITY): Payer: Self-pay | Admitting: Neurological Surgery

## 2012-12-11 DIAGNOSIS — M5126 Other intervertebral disc displacement, lumbar region: Secondary | ICD-10-CM

## 2012-12-13 ENCOUNTER — Ambulatory Visit (HOSPITAL_COMMUNITY)
Admission: RE | Admit: 2012-12-13 | Discharge: 2012-12-13 | Disposition: A | Discharge status: Home / Self Care | Payer: No Typology Code available for payment source | Source: Ambulatory Visit | Attending: Neurological Surgery | Admitting: Neurological Surgery

## 2012-12-13 DIAGNOSIS — M5126 Other intervertebral disc displacement, lumbar region: Secondary | ICD-10-CM | POA: Insufficient documentation

## 2012-12-13 DIAGNOSIS — G8929 Other chronic pain: Secondary | ICD-10-CM | POA: Insufficient documentation

## 2012-12-13 MED ORDER — GADOBENATE DIMEGLUMINE 529 MG/ML IV SOLN
15.0000 mL | Freq: Once | INTRAVENOUS | Status: AC | PRN
  Administered 2012-12-13: 15 mL via INTRAVENOUS

## 2013-01-27 ENCOUNTER — Other Ambulatory Visit: Payer: Self-pay | Admitting: Neurological Surgery

## 2013-01-28 ENCOUNTER — Encounter (HOSPITAL_COMMUNITY): Payer: Self-pay | Admitting: Pharmacy Technician

## 2013-01-30 ENCOUNTER — Encounter (HOSPITAL_COMMUNITY): Payer: Self-pay

## 2013-01-30 ENCOUNTER — Encounter (HOSPITAL_COMMUNITY)
Admission: RE | Admit: 2013-01-30 | Discharge: 2013-01-30 | Disposition: A | Discharge status: Home / Self Care | Payer: No Typology Code available for payment source | Source: Ambulatory Visit | Attending: Neurological Surgery | Admitting: Neurological Surgery

## 2013-01-30 HISTORY — DX: Paresthesia of skin: R20.2

## 2013-01-30 HISTORY — DX: Migraine, unspecified, not intractable, without status migrainosus: G43.909

## 2013-01-30 HISTORY — DX: Anesthesia of skin: R20.0

## 2013-01-30 LAB — CBC WITH DIFFERENTIAL/PLATELET
Eosinophils Absolute: 0.1 10*3/uL (ref 0.0–0.7)
Hemoglobin: 12.8 g/dL (ref 12.0–15.0)
Lymphocytes Relative: 34 % (ref 12–46)
Lymphs Abs: 2.2 10*3/uL (ref 0.7–4.0)
MCH: 30 pg (ref 26.0–34.0)
Monocytes Relative: 10 % (ref 3–12)
Neutro Abs: 3.5 10*3/uL (ref 1.7–7.7)
Neutrophils Relative %: 54 % (ref 43–77)
Platelets: 242 10*3/uL (ref 150–400)
RBC: 4.26 MIL/uL (ref 3.87–5.11)
WBC: 6.5 10*3/uL (ref 4.0–10.5)

## 2013-01-30 LAB — SURGICAL PCR SCREEN
MRSA, PCR: NEGATIVE
Staphylococcus aureus: NEGATIVE

## 2013-01-30 LAB — COMPREHENSIVE METABOLIC PANEL
AST: 18 U/L (ref 0–37)
BUN: 12 mg/dL (ref 6–23)
CO2: 22 mEq/L (ref 19–32)
Calcium: 9.2 mg/dL (ref 8.4–10.5)
Creatinine, Ser: 0.74 mg/dL (ref 0.50–1.10)
GFR calc non Af Amer: >90 mL/min (ref >90)

## 2013-01-30 LAB — PROTIME-INR
INR: 1 (ref 0.00–1.49)
Prothrombin Time: 13.1 seconds (ref 11.6–15.2)

## 2013-02-04 MED ORDER — CEFAZOLIN SODIUM-DEXTROSE 2-3 GM-% IV SOLR
2.0000 g | INTRAVENOUS | Status: AC
  Administered 2013-02-05: 2 g via INTRAVENOUS
  Filled 2013-02-04: qty 50

## 2013-02-04 MED ORDER — DEXAMETHASONE SODIUM PHOSPHATE 10 MG/ML IJ SOLN
10.0000 mg | INTRAMUSCULAR | Status: AC
  Administered 2013-02-05: 10 mg via INTRAVENOUS
  Filled 2013-02-04: qty 1

## 2013-02-05 ENCOUNTER — Ambulatory Visit (HOSPITAL_COMMUNITY): Payer: No Typology Code available for payment source

## 2013-02-05 ENCOUNTER — Encounter (HOSPITAL_COMMUNITY)
Admission: RE | Disposition: A | Discharge status: Home / Self Care | Payer: Self-pay | Source: Ambulatory Visit | Attending: Neurological Surgery

## 2013-02-05 ENCOUNTER — Ambulatory Visit (HOSPITAL_COMMUNITY)
Admission: RE | Admit: 2013-02-05 | Discharge: 2013-02-06 | Disposition: A | Discharge status: Home / Self Care | Payer: No Typology Code available for payment source | Source: Ambulatory Visit | Attending: Neurological Surgery | Admitting: Neurological Surgery

## 2013-02-05 ENCOUNTER — Ambulatory Visit (HOSPITAL_COMMUNITY): Payer: No Typology Code available for payment source | Admitting: Anesthesiology

## 2013-02-05 ENCOUNTER — Encounter (HOSPITAL_COMMUNITY): Payer: Self-pay | Admitting: Neurological Surgery

## 2013-02-05 ENCOUNTER — Encounter (HOSPITAL_COMMUNITY): Payer: Self-pay | Admitting: Anesthesiology

## 2013-02-05 DIAGNOSIS — Z01812 Encounter for preprocedural laboratory examination: Secondary | ICD-10-CM | POA: Insufficient documentation

## 2013-02-05 DIAGNOSIS — M5126 Other intervertebral disc displacement, lumbar region: Secondary | ICD-10-CM | POA: Insufficient documentation

## 2013-02-05 DIAGNOSIS — Z9889 Other specified postprocedural states: Secondary | ICD-10-CM

## 2013-02-05 HISTORY — PX: LUMBAR LAMINECTOMY/DECOMPRESSION MICRODISCECTOMY: SHX5026

## 2013-02-05 SURGERY — LUMBAR LAMINECTOMY/DECOMPRESSION MICRODISCECTOMY 1 LEVEL
Anesthesia: General | Site: Back | Wound class: Clean

## 2013-02-05 MED ORDER — ACETAMINOPHEN 10 MG/ML IV SOLN
INTRAVENOUS | Status: AC
  Administered 2013-02-05: 1000 mg via INTRAVENOUS
  Filled 2013-02-05: qty 100

## 2013-02-05 MED ORDER — METHOCARBAMOL 100 MG/ML IJ SOLN
500.0000 mg | Freq: Four times a day (QID) | INTRAVENOUS | Status: DC | PRN
  Filled 2013-02-05: qty 5

## 2013-02-05 MED ORDER — DEXAMETHASONE 4 MG PO TABS
4.0000 mg | ORAL_TABLET | Freq: Four times a day (QID) | ORAL | Status: DC
  Administered 2013-02-05 – 2013-02-06 (×2): 4 mg via ORAL
  Filled 2013-02-05 (×6): qty 1

## 2013-02-05 MED ORDER — LIDOCAINE HCL 4 % MT SOLN
OROMUCOSAL | Status: DC | PRN
  Administered 2013-02-05: 4 mL via TOPICAL

## 2013-02-05 MED ORDER — GLYCOPYRROLATE 0.2 MG/ML IJ SOLN
INTRAMUSCULAR | Status: DC | PRN
  Administered 2013-02-05 (×2): 0.1 mg via INTRAVENOUS
  Administered 2013-02-05: 0.6 mg via INTRAVENOUS

## 2013-02-05 MED ORDER — EPHEDRINE SULFATE 50 MG/ML IJ SOLN
INTRAMUSCULAR | Status: DC | PRN
  Administered 2013-02-05: 5 mg via INTRAVENOUS

## 2013-02-05 MED ORDER — CEFAZOLIN SODIUM 1-5 GM-% IV SOLN
1.0000 g | Freq: Three times a day (TID) | INTRAVENOUS | Status: AC
  Administered 2013-02-05 – 2013-02-06 (×2): 1 g via INTRAVENOUS
  Filled 2013-02-05 (×2): qty 50

## 2013-02-05 MED ORDER — METHOCARBAMOL 100 MG/ML IJ SOLN
500.0000 mg | INTRAVENOUS | Status: AC
  Administered 2013-02-05: 500 mg via INTRAVENOUS
  Filled 2013-02-05: qty 5

## 2013-02-05 MED ORDER — MIDAZOLAM HCL 5 MG/5ML IJ SOLN
INTRAMUSCULAR | Status: DC | PRN
  Administered 2013-02-05: 2 mg via INTRAVENOUS

## 2013-02-05 MED ORDER — THROMBIN 5000 UNITS EX SOLR
CUTANEOUS | Status: DC | PRN
  Administered 2013-02-05 (×2): 5000 [IU] via TOPICAL

## 2013-02-05 MED ORDER — OXYCODONE HCL 5 MG/5ML PO SOLN: 5.0000 mg | Freq: Once | ORAL | Status: AC | PRN

## 2013-02-05 MED ORDER — HYDROMORPHONE HCL PF 1 MG/ML IJ SOLN
INTRAMUSCULAR | Status: AC
  Filled 2013-02-05: qty 1

## 2013-02-05 MED ORDER — PHENYLEPHRINE HCL 10 MG/ML IJ SOLN
INTRAMUSCULAR | Status: DC | PRN
  Administered 2013-02-05 (×2): 80 ug via INTRAVENOUS

## 2013-02-05 MED ORDER — OXYCODONE HCL 5 MG PO TABS
ORAL_TABLET | ORAL | Status: AC
Start: 2013-02-05 — End: 2013-02-06
  Filled 2013-02-05: qty 1

## 2013-02-05 MED ORDER — BACITRACIN 50000 UNITS IM SOLR
INTRAMUSCULAR | Status: AC
  Filled 2013-02-05: qty 1

## 2013-02-05 MED ORDER — PHENOL 1.4 % MT LIQD: 1.0000 | OROMUCOSAL | Status: DC | PRN

## 2013-02-05 MED ORDER — OXYCODONE HCL 5 MG PO TABS
10.0000 mg | ORAL_TABLET | ORAL | Status: DC | PRN
  Administered 2013-02-05 – 2013-02-06 (×3): 10 mg via ORAL
  Filled 2013-02-05 (×3): qty 2

## 2013-02-05 MED ORDER — ROCURONIUM BROMIDE 100 MG/10ML IV SOLN
INTRAVENOUS | Status: DC | PRN
  Administered 2013-02-05 (×2): 50 mg via INTRAVENOUS

## 2013-02-05 MED ORDER — DEXAMETHASONE SODIUM PHOSPHATE 4 MG/ML IJ SOLN
4.0000 mg | Freq: Four times a day (QID) | INTRAMUSCULAR | Status: DC
  Administered 2013-02-06: 4 mg via INTRAVENOUS
  Filled 2013-02-05 (×5): qty 1

## 2013-02-05 MED ORDER — THROMBIN 5000 UNITS EX SOLR
OROMUCOSAL | Status: DC | PRN
  Administered 2013-02-05: 14:00:00 via TOPICAL

## 2013-02-05 MED ORDER — LIDOCAINE HCL (CARDIAC) 20 MG/ML IV SOLN
INTRAVENOUS | Status: DC | PRN
  Administered 2013-02-05: 70 mg via INTRAVENOUS

## 2013-02-05 MED ORDER — HEMOSTATIC AGENTS (NO CHARGE) OPTIME
TOPICAL | Status: DC | PRN
  Administered 2013-02-05: 1 via TOPICAL

## 2013-02-05 MED ORDER — SODIUM CHLORIDE 0.9 % IR SOLN
Status: DC | PRN
  Administered 2013-02-05: 14:00:00

## 2013-02-05 MED ORDER — ONDANSETRON HCL 4 MG/2ML IJ SOLN
INTRAMUSCULAR | Status: DC | PRN
  Administered 2013-02-05: 4 mg via INTRAVENOUS

## 2013-02-05 MED ORDER — SODIUM CHLORIDE 0.9 % IJ SOLN
3.0000 mL | Freq: Two times a day (BID) | INTRAMUSCULAR | Status: DC
  Administered 2013-02-05: 3 mL via INTRAVENOUS

## 2013-02-05 MED ORDER — SODIUM CHLORIDE 0.9 % IV SOLN
INTRAVENOUS | Status: AC
  Filled 2013-02-05: qty 500

## 2013-02-05 MED ORDER — MORPHINE SULFATE 2 MG/ML IJ SOLN
1.0000 mg | INTRAMUSCULAR | Status: DC | PRN
  Administered 2013-02-05 – 2013-02-06 (×2): 4 mg via INTRAVENOUS
  Filled 2013-02-05 (×2): qty 2

## 2013-02-05 MED ORDER — OXYCODONE HCL 5 MG PO TABS
5.0000 mg | ORAL_TABLET | Freq: Once | ORAL | Status: AC | PRN
  Administered 2013-02-05: 5 mg via ORAL

## 2013-02-05 MED ORDER — POTASSIUM CHLORIDE IN NACL 20-0.9 MEQ/L-% IV SOLN
INTRAVENOUS | Status: DC
  Filled 2013-02-05 (×3): qty 1000

## 2013-02-05 MED ORDER — NEOSTIGMINE METHYLSULFATE 1 MG/ML IJ SOLN
INTRAMUSCULAR | Status: DC | PRN
  Administered 2013-02-05: 4 mg via INTRAVENOUS

## 2013-02-05 MED ORDER — PROPOFOL 10 MG/ML IV BOLUS
INTRAVENOUS | Status: DC | PRN
  Administered 2013-02-05: 180 mg via INTRAVENOUS

## 2013-02-05 MED ORDER — METHOCARBAMOL 500 MG PO TABS
500.0000 mg | ORAL_TABLET | Freq: Four times a day (QID) | ORAL | Status: DC | PRN
  Administered 2013-02-05: 500 mg via ORAL
  Filled 2013-02-05: qty 1

## 2013-02-05 MED ORDER — ACETAMINOPHEN 10 MG/ML IV SOLN
1000.0000 mg | Freq: Four times a day (QID) | INTRAVENOUS | Status: DC
Start: 2013-02-05 — End: 2013-02-06
  Administered 2013-02-05 – 2013-02-06 (×3): 1000 mg via INTRAVENOUS
  Filled 2013-02-05 (×4): qty 100

## 2013-02-05 MED ORDER — HYDROMORPHONE HCL PF 1 MG/ML IJ SOLN
0.2500 mg | INTRAMUSCULAR | Status: DC | PRN
  Administered 2013-02-05 (×4): 0.5 mg via INTRAVENOUS

## 2013-02-05 MED ORDER — FENTANYL CITRATE 0.05 MG/ML IJ SOLN
INTRAMUSCULAR | Status: DC | PRN
  Administered 2013-02-05 (×2): 50 ug via INTRAVENOUS
  Administered 2013-02-05: 100 ug via INTRAVENOUS
  Administered 2013-02-05 (×6): 50 ug via INTRAVENOUS

## 2013-02-05 MED ORDER — MENTHOL 3 MG MT LOZG: 1.0000 | LOZENGE | OROMUCOSAL | Status: DC | PRN

## 2013-02-05 MED ORDER — ONDANSETRON HCL 4 MG/2ML IJ SOLN: 4.0000 mg | INTRAMUSCULAR | Status: DC | PRN

## 2013-02-05 MED ORDER — LACTATED RINGERS IV SOLN
INTRAVENOUS | Status: DC | PRN
  Administered 2013-02-05 (×2): via INTRAVENOUS

## 2013-02-05 MED ORDER — ACETAMINOPHEN 650 MG RE SUPP: 650.0000 mg | RECTAL | Status: DC | PRN

## 2013-02-05 MED ORDER — SODIUM CHLORIDE 0.9 % IJ SOLN: 3.0000 mL | INTRAMUSCULAR | Status: DC | PRN

## 2013-02-05 MED ORDER — BUPIVACAINE HCL (PF) 0.25 % IJ SOLN
INTRAMUSCULAR | Status: DC | PRN
  Administered 2013-02-05: 3 mL

## 2013-02-05 MED ORDER — ONDANSETRON HCL 4 MG/2ML IJ SOLN: 4.0000 mg | Freq: Four times a day (QID) | INTRAMUSCULAR | Status: DC | PRN

## 2013-02-05 MED ORDER — ARTIFICIAL TEARS OP OINT
TOPICAL_OINTMENT | OPHTHALMIC | Status: DC | PRN
  Administered 2013-02-05: 1 via OPHTHALMIC

## 2013-02-05 MED ORDER — ACETAMINOPHEN 325 MG PO TABS: 650.0000 mg | ORAL_TABLET | ORAL | Status: DC | PRN

## 2013-02-05 SURGICAL SUPPLY — 49 items
APL SKNCLS STERI-STRIP NONHPOA (GAUZE/BANDAGES/DRESSINGS) ×1
BAG DECANTER FOR FLEXI CONT (MISCELLANEOUS) ×2 IMPLANT
BENZOIN TINCTURE PRP APPL 2/3 (GAUZE/BANDAGES/DRESSINGS) ×2 IMPLANT
BUR MATCHSTICK NEURO 3.0 LAGG (BURR) ×2 IMPLANT
CANISTER SUCTION 2500CC (MISCELLANEOUS) ×2 IMPLANT
CLOTH BEACON ORANGE TIMEOUT ST (SAFETY) ×2 IMPLANT
CONT SPEC 4OZ CLIKSEAL STRL BL (MISCELLANEOUS) ×2 IMPLANT
CORDS BIPOLAR (ELECTRODE) ×1 IMPLANT
DRAPE LAPAROTOMY 100X72X124 (DRAPES) ×2 IMPLANT
DRAPE MICROSCOPE ZEISS OPMI (DRAPES) ×2 IMPLANT
DRAPE POUCH INSTRU U-SHP 10X18 (DRAPES) ×2 IMPLANT
DRAPE SURG 17X23 STRL (DRAPES) ×2 IMPLANT
DRESSING TELFA 8X3 (GAUZE/BANDAGES/DRESSINGS) ×2 IMPLANT
DRSG OPSITE 4X5.5 SM (GAUZE/BANDAGES/DRESSINGS) ×2 IMPLANT
DRSG OPSITE POSTOP 4X6 (GAUZE/BANDAGES/DRESSINGS) ×1 IMPLANT
DURAPREP 26ML APPLICATOR (WOUND CARE) ×2 IMPLANT
ELECT REM PT RETURN 9FT ADLT (ELECTROSURGICAL) ×2
ELECTRODE REM PT RTRN 9FT ADLT (ELECTROSURGICAL) ×1 IMPLANT
GAUZE SPONGE 4X4 16PLY XRAY LF (GAUZE/BANDAGES/DRESSINGS) IMPLANT
GLOVE BIOGEL M 8.0 STRL (GLOVE) ×2 IMPLANT
GLOVE BIOGEL PI IND STRL 8 (GLOVE) IMPLANT
GLOVE BIOGEL PI INDICATOR 8 (GLOVE) ×1
GLOVE ECLIPSE 6.5 STRL STRAW (GLOVE) ×1 IMPLANT
GLOVE SURG SS PI 8.5 STRL IVOR (GLOVE) ×2
GLOVE SURG SS PI 8.5 STRL STRW (GLOVE) IMPLANT
GOWN BRE IMP SLV AUR LG STRL (GOWN DISPOSABLE) ×1 IMPLANT
GOWN BRE IMP SLV AUR XL STRL (GOWN DISPOSABLE) ×3 IMPLANT
GOWN STRL REIN 2XL LVL4 (GOWN DISPOSABLE) IMPLANT
HEMOSTAT POWDER KIT SURGIFOAM (HEMOSTASIS) IMPLANT
KIT BASIN OR (CUSTOM PROCEDURE TRAY) ×2 IMPLANT
KIT ROOM TURNOVER OR (KITS) ×2 IMPLANT
NDL HYPO 25X1 1.5 SAFETY (NEEDLE) ×1 IMPLANT
NDL SPNL 20GX3.5 QUINCKE YW (NEEDLE) IMPLANT
NEEDLE HYPO 25X1 1.5 SAFETY (NEEDLE) ×2 IMPLANT
NEEDLE SPNL 20GX3.5 QUINCKE YW (NEEDLE) IMPLANT
NS IRRIG 1000ML POUR BTL (IV SOLUTION) ×2 IMPLANT
PACK LAMINECTOMY NEURO (CUSTOM PROCEDURE TRAY) ×2 IMPLANT
PAD ARMBOARD 7.5X6 YLW CONV (MISCELLANEOUS) ×6 IMPLANT
RUBBERBAND STERILE (MISCELLANEOUS) ×4 IMPLANT
SPONGE SURGIFOAM ABS GEL SZ50 (HEMOSTASIS) ×2 IMPLANT
STRIP CLOSURE SKIN 1/2X4 (GAUZE/BANDAGES/DRESSINGS) ×2 IMPLANT
SUT VIC AB 0 CT1 18XCR BRD8 (SUTURE) ×1 IMPLANT
SUT VIC AB 0 CT1 8-18 (SUTURE) ×2
SUT VIC AB 2-0 CP2 18 (SUTURE) ×2 IMPLANT
SUT VIC AB 3-0 SH 8-18 (SUTURE) ×2 IMPLANT
SYR 20ML ECCENTRIC (SYRINGE) ×2 IMPLANT
TOWEL OR 17X24 6PK STRL BLUE (TOWEL DISPOSABLE) ×2 IMPLANT
TOWEL OR 17X26 10 PK STRL BLUE (TOWEL DISPOSABLE) ×2 IMPLANT
WATER STERILE IRR 1000ML POUR (IV SOLUTION) ×2 IMPLANT

## 2013-02-05 NOTE — Anesthesia Postprocedure Evaluation (Signed)
Anesthesia Post Note  Patient: Rhonda Key  Procedure(s) Performed: Procedure(s) (LRB): REDO LUMBAR FIVE SACRAL ONE MICRODISKECTOMY, RIGHT (N/A)  Anesthesia type: General  Patient location: PACU  Post pain: Pain level controlled and Adequate analgesia  Post assessment: Post-op Vital signs reviewed, Patient's Cardiovascular Status Stable, Respiratory Function Stable, Patent Airway and Pain level controlled  Last Vitals:  Filed Vitals:   02/05/13 1448  BP: 98/56  Pulse: 62  Temp:   Resp: 13    Post vital signs: Reviewed and stable  Level of consciousness: awake, alert  and oriented  Complications: No apparent anesthesia complications

## 2013-02-05 NOTE — Plan of Care (Signed)
Problem: Consults Goal: Diagnosis - Spinal Surgery Outcome: Completed/Met Date Met:  02/05/13 Lumbar Laminectomy (Complex)

## 2013-02-05 NOTE — Plan of Care (Signed)
Problem: Consults Goal: Diagnosis - Spinal Surgery Lumbar Laminectomy (Complex)     

## 2013-02-05 NOTE — Preoperative (Signed)
Beta Blockers   Reason not to administer Beta Blockers:Not Applicable 

## 2013-02-05 NOTE — Anesthesia Procedure Notes (Signed)
Procedure Name: Intubation Date/Time: 02/05/2013 1:20 PM Performed by: Gayla Medicus Pre-anesthesia Checklist: Patient identified, Timeout performed, Emergency Drugs available, Suction available and Patient being monitored Patient Re-evaluated:Patient Re-evaluated prior to inductionOxygen Delivery Method: Circle system utilized Preoxygenation: Pre-oxygenation with 100% oxygen Intubation Type: IV induction Ventilation: Mask ventilation without difficulty Laryngoscope Size: Mac and 3 Grade View: Grade I Tube type: Oral Tube size: 7.5 mm Number of attempts: 1 Airway Equipment and Method: Stylet and LTA kit utilized Placement Confirmation: ETT inserted through vocal cords under direct vision,  positive ETCO2 and breath sounds checked- equal and bilateral Secured at: 22 cm Tube secured with: Tape Dental Injury: Teeth and Oropharynx as per pre-operative assessment
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
## 2013-02-05 NOTE — Transfer of Care (Signed)
Immediate Anesthesia Transfer of Care Note  Patient: Rhonda Key  Procedure(s) Performed: Procedure(s) with comments: REDO LUMBAR FIVE SACRAL ONE MICRODISKECTOMY, RIGHT (N/A) - LUMBAR LAMINECTOMY/DECOMPRESSION MICRODISCECTOMY 1 LEVEL  Patient Location: PACU  Anesthesia Type:General  Level of Consciousness: awake, alert , oriented and patient cooperative  Airway & Oxygen Therapy: Patient Spontanous Breathing and Patient connected to nasal cannula oxygen  Post-op Assessment: Report given to PACU RN and Post -op Vital signs reviewed and stable  Post vital signs: Reviewed and stable  Complications: No apparent anesthesia complications

## 2013-02-05 NOTE — Anesthesia Preprocedure Evaluation (Addendum)
Anesthesia Evaluation  Patient identified by MRN, date of birth, ID band Patient awake    Reviewed: Allergy & Precautions, H&P , NPO status , Patient's Chart, lab work & pertinent test results  History of Anesthesia Complications Negative for: history of anesthetic complications  Airway Mallampati: II TM Distance: >3 FB Neck ROM: full    Dental  (+) Teeth Intact and Dental Advisory Given   Pulmonary Current Smoker,  breath sounds clear to auscultation        Cardiovascular negative cardio ROS  Rhythm:Regular Rate:Normal     Neuro/Psych  Headaches,    GI/Hepatic negative GI ROS, Neg liver ROS,   Endo/Other  negative endocrine ROS  Renal/GU negative Renal ROS     Musculoskeletal   Abdominal   Peds  Hematology negative hematology ROS (+)   Anesthesia Other Findings   Reproductive/Obstetrics negative OB ROS                          Anesthesia Physical Anesthesia Plan  ASA: II  Anesthesia Plan: General   Post-op Pain Management:    Induction: Intravenous  Airway Management Planned: Oral ETT  Additional Equipment:   Intra-op Plan:   Post-operative Plan: Extubation in OR  Informed Consent: I have reviewed the patients History and Physical, chart, labs and discussed the procedure including the risks, benefits and alternatives for the proposed anesthesia with the patient or authorized representative who has indicated his/her understanding and acceptance.     Plan Discussed with: CRNA, Anesthesiologist and Surgeon  Anesthesia Plan Comments:         Anesthesia Quick Evaluation
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
## 2013-02-06 ENCOUNTER — Encounter (HOSPITAL_COMMUNITY): Payer: Self-pay | Admitting: Neurological Surgery

## 2013-02-06 MED ORDER — OXYCODONE HCL 10 MG PO TABS: 10.0000 mg | ORAL_TABLET | ORAL | Status: DC | PRN

## 2013-02-06 MED ORDER — METHOCARBAMOL 500 MG PO TABS: 500.0000 mg | ORAL_TABLET | Freq: Four times a day (QID) | ORAL | Status: DC | PRN

## 2013-02-06 NOTE — Progress Notes (Signed)
Pt. Alert and oriented, follows simple instructions, denies pain. Incision area without swelling, redness or S/S of infection. Voiding adequate clear yellow urine. Moving all extremities well and vitals stable and documented. Lumbar surgery notes instructions given to patient and family member for home safety and precautions. Pt. and family stated understanding of instructions given. Pain meds given per Pt.'s request for pain and discomfort of ride home. 

## 2013-02-06 NOTE — Discharge Summary (Signed)
Physician Discharge Summary  Patient ID: Rhonda Key MRN: [id number] DOB/AGE: 1986-07-13 27 y.o.  Admit date: 02/05/2013 Discharge date: 02/06/2013  Admission Diagnoses: Recurrent L5-S1 disc herniation    Discharge Diagnoses: Same   Discharged Condition: good  Hospital Course: The patient was admitted on 02/05/2013 and taken to the operating room where the patient underwent redo right L5-S1 microdiscectomy. The patient tolerated the procedure well and was taken to the recovery room and then to the floor in stable condition. The hospital course was routine. There were no complications. The wound remained clean dry and intact. Pt had appropriate back soreness. No complaints of leg pain or new N/T/W. The patient remained afebrile with stable vital signs, and tolerated a regular diet. The patient continued to increase activities, and pain was well controlled with oral pain medications.   Consults: None  Significant Diagnostic Studies:  Results for orders placed during the hospital encounter of 01/30/13  SURGICAL PCR SCREEN      Result Value Range   MRSA, PCR NEGATIVE  NEGATIVE   Staphylococcus aureus NEGATIVE  NEGATIVE  HCG, SERUM, QUALITATIVE      Result Value Range   Preg, Serum NEGATIVE  NEGATIVE  COMPREHENSIVE METABOLIC PANEL      Result Value Range   Sodium 140  135 - 145 mEq/L   Potassium 3.7  3.5 - 5.1 mEq/L   Chloride 105  96 - 112 mEq/L   CO2 22  19 - 32 mEq/L   Glucose, Bld 90  70 - 99 mg/dL   BUN 12  6 - 23 mg/dL   Creatinine, Ser 4.09  0.50 - 1.10 mg/dL   Calcium 9.2  8.4 - 81.1 mg/dL   Total Protein 6.8  6.0 - 8.3 g/dL   Albumin 4.4  3.5 - 5.2 g/dL   AST 18  0 - 37 U/L   ALT 18  0 - 35 U/L   Alkaline Phosphatase 71  39 - 117 U/L   Total Bilirubin 0.4  0.3 - 1.2 mg/dL   GFR calc non Af Amer >90  >90 mL/min   GFR calc Af Amer >90  >90 mL/min  CBC WITH DIFFERENTIAL      Result Value Range   WBC 6.5  4.0 - 10.5 K/uL   RBC 4.26  3.87 - 5.11 MIL/uL   Hemoglobin 12.8  12.0 - 15.0 g/dL   HCT 91.4  78.2 - 95.6 %   MCV 86.4  78.0 - 100.0 fL   MCH 30.0  26.0 - 34.0 pg   MCHC 34.8  30.0 - 36.0 g/dL   RDW 21.3  08.6 - 57.8 %   Platelets 242  150 - 400 K/uL   Neutrophils Relative % 54  43 - 77 %   Neutro Abs 3.5  1.7 - 7.7 K/uL   Lymphocytes Relative 34  12 - 46 %   Lymphs Abs 2.2  0.7 - 4.0 K/uL   Monocytes Relative 10  3 - 12 %   Monocytes Absolute 0.6  0.1 - 1.0 K/uL   Eosinophils Relative 2  0 - 5 %   Eosinophils Absolute 0.1  0.0 - 0.7 K/uL   Basophils Relative 1  0 - 1 %   Basophils Absolute 0.0  0.0 - 0.1 K/uL  PROTIME-INR      Result Value Range   Prothrombin Time 13.1  11.6 - 15.2 seconds   INR 1.00  0.00 - 1.49    Dg Lumbar Spine 1 View  02/05/2013   *RADIOLOGY REPORT*  Clinical Data: Lumbar laminectomy  LUMBAR SPINE - 1 VIEW  Comparison: MR 12/13/2012  Findings: Portable lateral intraoperative radiograph shows metallic probe directed towards the L5-S1 interspace from posterior approach  IMPRESSION: Intraoperative localization   Original Report Authenticated By: D. Andria Rhein, MD    Antibiotics:  Anti-infectives   Start     Dose/Rate Route Frequency Ordered Stop   02/05/13 2100  ceFAZolin (ANCEF) IVPB 1 g/50 mL premix     1 g 100 mL/hr over 30 Minutes Intravenous Every 8 hours 02/05/13 1727 02/06/13 0535   02/05/13 1356  bacitracin 50,000 Units in sodium chloride irrigation 0.9 % 500 mL irrigation  Status:  Discontinued       As needed 02/05/13 1356 02/05/13 1426   02/05/13 1252  bacitracin 69629 UNITS injection    Comments:  LOWERY, STEVEN: cabinet override      02/05/13 1252 02/06/13 0059   02/05/13 0600  ceFAZolin (ANCEF) IVPB 2 g/50 mL premix     2 g 100 mL/hr over 30 Minutes Intravenous On call to O.R. 02/04/13 1423 02/05/13 1312      Discharge Exam: Blood pressure 91/52, pulse 51, temperature 99 F (37.2 C), temperature source Oral, resp. rate 18, last menstrual period 01/23/2013, SpO2 97.00%. Neurologic:  Grossly normal Incision clean and intact  Discharge Medications:     Medication List    STOP taking these medications       acetaminophen 500 MG tablet  Commonly known as:  TYLENOL      TAKE these medications       methocarbamol 500 MG tablet  Commonly known as:  ROBAXIN  Take 1 tablet (500 mg total) by mouth every 6 (six) hours as needed.     Oxycodone HCl 10 MG Tabs  Take 1 tablet (10 mg total) by mouth every 4 (four) hours as needed for pain.        Disposition: Home   Final Dx: Redo microdiscectomy      Discharge Orders   Future Orders Complete By Expires     Call MD for:  difficulty breathing, headache or visual disturbances  As directed     Call MD for:  persistant nausea and vomiting  As directed     Call MD for:  redness, tenderness, or signs of infection (pain, swelling, redness, odor or green/yellow discharge around incision site)  As directed     Call MD for:  severe uncontrolled pain  As directed     Call MD for:  temperature >100.4  As directed     Diet - low sodium heart healthy  As directed     Discharge instructions  As directed     Comments:      No lifting, bending, driving, or strenuous activity    Increase activity slowly  As directed     Remove dressing in 48 hours  As directed        Follow-up Information   Follow up with Luccia Reinheimer S, MD In 2 weeks.   Contact information:   [address] [place] [address] [phone]        Signed: Tia Alert 02/06/2013, 7:19 AM

## 2013-09-04 ENCOUNTER — Encounter (HOSPITAL_COMMUNITY): Payer: Self-pay | Admitting: Emergency Medicine

## 2013-09-04 ENCOUNTER — Emergency Department (HOSPITAL_COMMUNITY)
Admission: EM | Admit: 2013-09-04 | Discharge: 2013-09-04 | Disposition: A | Discharge status: Home / Self Care | Payer: Medicaid Other | Attending: Emergency Medicine | Admitting: Emergency Medicine

## 2013-09-04 ENCOUNTER — Other Ambulatory Visit: Payer: Self-pay | Admitting: Neurological Surgery

## 2013-09-04 DIAGNOSIS — Z79899 Other long term (current) drug therapy: Secondary | ICD-10-CM | POA: Insufficient documentation

## 2013-09-04 DIAGNOSIS — K0889 Other specified disorders of teeth and supporting structures: Secondary | ICD-10-CM

## 2013-09-04 DIAGNOSIS — K047 Periapical abscess without sinus: Secondary | ICD-10-CM

## 2013-09-04 DIAGNOSIS — K0381 Cracked tooth: Secondary | ICD-10-CM | POA: Insufficient documentation

## 2013-09-04 DIAGNOSIS — K089 Disorder of teeth and supporting structures, unspecified: Secondary | ICD-10-CM | POA: Insufficient documentation

## 2013-09-04 DIAGNOSIS — M5430 Sciatica, unspecified side: Secondary | ICD-10-CM | POA: Insufficient documentation

## 2013-09-04 DIAGNOSIS — Z8669 Personal history of other diseases of the nervous system and sense organs: Secondary | ICD-10-CM | POA: Insufficient documentation

## 2013-09-04 DIAGNOSIS — F17200 Nicotine dependence, unspecified, uncomplicated: Secondary | ICD-10-CM | POA: Insufficient documentation

## 2013-09-04 DIAGNOSIS — M5416 Radiculopathy, lumbar region: Secondary | ICD-10-CM

## 2013-09-04 MED ORDER — KETOROLAC TROMETHAMINE 60 MG/2ML IM SOLN
60.0000 mg | Freq: Once | INTRAMUSCULAR | Status: AC
  Administered 2013-09-04: 60 mg via INTRAMUSCULAR
  Filled 2013-09-04: qty 2

## 2013-09-04 MED ORDER — CLINDAMYCIN HCL 150 MG PO CAPS: 450.0000 mg | ORAL_CAPSULE | Freq: Four times a day (QID) | ORAL | Status: DC

## 2013-09-04 MED ORDER — HYDROCODONE-ACETAMINOPHEN 5-325 MG PO TABS: 1.0000 | ORAL_TABLET | Freq: Four times a day (QID) | ORAL | Status: DC | PRN

## 2013-09-04 NOTE — ED Provider Notes (Signed)
CSN: [id number]     Arrival date & time 09/04/13  1106 History  This chart was scribed for non-physician practitioner, Raymon Mutton, PA-C working with Raeford Razor, MD by Greggory Stallion, ED scribe. This patient was seen in room TR04C/TR04C and the patient's care was started at 12:47 PM.   Chief Complaint  Patient presents with  . Dental Pain   The history is provided by the patient. No language interpreter was used.   HPI Comments: Rhonda Key is a 27 y/o F with PMHx of sciatica, migraines presenting to the ED with dental pain that started over 3 months ago with worsening over the past couple of days. Patient reported that she broke her tooth and stated that the pain has increased. The pain is localized to the left lower jawline described as a constant sharp, pulsating pain with radiation to the left side of the face. Patient reported that she has been using nothing for pain relief. Stated that she noticed swelling beginning yesterday. Reported that she has not been to the dentist in over 10 years. Denied bleeding, pus drainage, fevers, neck pain, neck stiffness, difficulty swallowing. PCP none    Past Medical History  Diagnosis Date  . Acute back pain with sciatica   . Migraine   . Numbness and tingling of right leg    Past Surgical History  Procedure Laterality Date  . Dilation and curettage of uterus    . Lumbar laminectomy/decompression microdiscectomy  10/23/2012    Procedure: LUMBAR LAMINECTOMY/DECOMPRESSION MICRODISCECTOMY 1 LEVEL;  Surgeon: Tia Alert, MD;  Location: [facility];  Service: Neurosurgery;  Laterality: Right;  Right Lumbar five-Sacral one microdiscectomy  . Back surgery    . Wisdom tooth extraction    . Lumbar laminectomy/decompression microdiscectomy N/A 02/05/2013    Procedure: REDO LUMBAR FIVE SACRAL ONE MICRODISKECTOMY, RIGHT;  Surgeon: Tia Alert, MD;  Location: [facility];  Service: Neurosurgery;  Laterality: N/A;  LUMBAR LAMINECTOMY/DECOMPRESSION  MICRODISCECTOMY 1 LEVEL   History reviewed. No pertinent family history. History  Substance Use Topics  . Smoking status: Current Every Day Smoker -- 0.50 packs/day for 5 years    Types: Cigarettes  . Smokeless tobacco: Not on file  . Alcohol Use: No   OB History   Grav Para Term Preterm Abortions TAB SAB Ect Mult Living                 Review of Systems  Constitutional: Negative for fever.  HENT: Positive for dental problem and facial swelling.   Eyes: Negative for visual disturbance.  Musculoskeletal: Negative for neck pain and neck stiffness.  Neurological: Positive for numbness.  All other systems reviewed and are negative.    Allergies  Review of patient's allergies indicates no known allergies.  Home Medications   Current Outpatient Rx  Name  Route  Sig  Dispense  Refill  . amitriptyline (ELAVIL) 75 MG tablet   Oral   Take 75 mg by mouth at bedtime.         . clindamycin (CLEOCIN) 150 MG capsule   Oral   Take 3 capsules (450 mg total) by mouth every 6 (six) hours.   90 capsule   0   . HYDROcodone-acetaminophen (NORCO/VICODIN) 5-325 MG per tablet   Oral   Take 1 tablet by mouth every 6 (six) hours as needed.   5 tablet   0    BP 116/62  Pulse 100  Temp(Src) 97.7 F (36.5 C) (Oral)  Resp 20  Ht 5' 9.75" (1.772 m)  Wt 170 lb (77.111 kg)  BMI 24.56 kg/m2  SpO2 95%  LMP 08/18/2013  Physical Exam  Nursing note and vitals reviewed. Constitutional: She is oriented to person, place, and time. She appears well-developed and well-nourished. No distress.  HENT:  Head: Normocephalic and atraumatic.  Mouth/Throat:    Swelling localized to the left mandibular jawline with negative erythema, inflammation noted to the skin. Third premolar of left mandibular jawline noted to be diagrammed in decaying process. Swelling identified to the gumline with negative erythema, inflammation, lesions, sores, drainage, bleeding, pus. Discomfort upon palpation. Negative  trismus. Uvula midline, symmetrical elevation. Negative sublingual lesions.  Eyes: EOM are normal.  Neck: Normal range of motion. Neck supple. No tracheal deviation present.  Negative neck stiffness Negative rigidity Negative cervical lymphadenopathy Negative meningeal signs  Cardiovascular: Normal rate, regular rhythm and normal heart sounds.   Pulses:      Radial pulses are 2+ on the right side, and 2+ on the left side.  Pulmonary/Chest: Effort normal and breath sounds normal. No respiratory distress. She has no wheezes. She has no rales.  Musculoskeletal: Normal range of motion.  Neurological: She is alert and oriented to person, place, and time. No cranial nerve deficit. She exhibits normal muscle tone. Coordination normal.  Cranial nerves III through XII grossly intact  Skin: Skin is warm and dry.  Psychiatric: She has a normal mood and affect. Her behavior is normal.    ED Course  Procedures (including critical care time)  DIAGNOSTIC STUDIES: Oxygen Saturation is 95% on RA, adequate by my interpretation.    COORDINATION OF CARE: 12:51 PM-Discussed treatment plan which includes an antibiotic and pain medication with pt at bedside and pt agreed to plan.   Labs Review Labs Reviewed - No data to display Imaging Review No results found.  EKG Interpretation   None       MDM   1. Pain, dental   2. Periapical abscess     Medications  ketorolac (TORADOL) injection 60 mg (60 mg Intramuscular Given 09/04/13 1326)   Filed Vitals:   09/04/13 1118  BP: 116/62  Pulse: 100  Temp: 97.7 F (36.5 C)  TempSrc: Oral  Resp: 20  Height: 5' 9.75" (1.772 m)  Weight: 170 lb (77.111 kg)  SpO2: 95%   I personally performed the services described in this documentation, which was scribed in my presence. The recorded information has been reviewed and is accurate.  Patient presenting to emergency department with dental pain that has been ongoing for the past 3 months or more, with  swelling localized left side of the face that started yesterday. Patient reports she's not been taking anything over-the-counter for pain relief. Alert and oriented. GCS 15. Heart rate and rhythm normal. Pulses palpable and strong, radial 2+ bilaterally. Lungs clear to auscultation to upper and lower lobes bilaterally. Negative pain upon palpation to the neck-full range of motion to the neck, supple. Negative trismus. Uvula midline, symmetrical elevation. Third molar of left mandibular region identified to be diagrammed in decaying process with swelling localized to the gumline. Negative drainage, bleeding, formation, erythema noted. Doubt peritonsillar abscess. Doubt Ludwig's angina. Dental pain secondary to poor dentition diagrammed to the decaying process. Pain medications uncertain ED setting. Patient stable, afebrile. Discharge patient with antibiotics and small dose of pain medications discussed course, course, disposal technique. Discussed with patient to closely monitor symptoms and if symptoms are to worsen or change report back to emergency department - strict  return structures given. Patient agreed to plan of care, understood, all questions answered  Raymon Mutton, PA-C 09/05/13 1047

## 2013-09-04 NOTE — ED Notes (Signed)
Reports left lower broken tooth and having pain and swelling to face. Airway intact.

## 2013-09-12 NOTE — ED Provider Notes (Signed)
Medical screening examination/treatment/procedure(s) were performed by non-physician practitioner and as supervising physician I was immediately available for consultation/collaboration.  EKG Interpretation   None        Raeford Razor, MD 09/12/13 1711

## 2013-09-22 ENCOUNTER — Ambulatory Visit
Admission: RE | Admit: 2013-09-22 | Discharge: 2013-09-22 | Disposition: A | Discharge status: Home / Self Care | Payer: Medicaid Other | Source: Ambulatory Visit | Attending: Neurological Surgery | Admitting: Neurological Surgery

## 2013-09-22 DIAGNOSIS — M5416 Radiculopathy, lumbar region: Secondary | ICD-10-CM

## 2013-09-22 MED ORDER — GADOBENATE DIMEGLUMINE 529 MG/ML IV SOLN
15.0000 mL | Freq: Once | INTRAVENOUS | Status: AC | PRN
  Administered 2013-09-22: 15 mL via INTRAVENOUS

## 2014-08-26 ENCOUNTER — Ambulatory Visit (INDEPENDENT_AMBULATORY_CARE_PROVIDER_SITE_OTHER): Payer: BC Managed Care – PPO | Admitting: Family Medicine

## 2014-08-26 ENCOUNTER — Encounter: Payer: Self-pay | Admitting: Family Medicine

## 2014-08-26 VITALS — BP 106/68 | HR 65 | Temp 98.5°F | Resp 16 | Ht 69.75 in | Wt 179.6 lb

## 2014-08-26 DIAGNOSIS — N3090 Cystitis, unspecified without hematuria: Secondary | ICD-10-CM

## 2014-08-26 DIAGNOSIS — M5416 Radiculopathy, lumbar region: Secondary | ICD-10-CM

## 2014-08-26 DIAGNOSIS — G8929 Other chronic pain: Secondary | ICD-10-CM

## 2014-08-26 DIAGNOSIS — F411 Generalized anxiety disorder: Secondary | ICD-10-CM

## 2014-08-26 DIAGNOSIS — R3915 Urgency of urination: Secondary | ICD-10-CM

## 2014-08-26 DIAGNOSIS — Z1159 Encounter for screening for other viral diseases: Secondary | ICD-10-CM

## 2014-08-26 DIAGNOSIS — M5410 Radiculopathy, site unspecified: Secondary | ICD-10-CM

## 2014-08-26 LAB — POCT UA - MICROSCOPIC ONLY
CRYSTALS, UR, HPF, POC: NEGATIVE
Casts, Ur, LPF, POC: NEGATIVE
Mucus, UA: NEGATIVE
YEAST UA: NEGATIVE

## 2014-08-26 LAB — POCT URINALYSIS DIPSTICK
Bilirubin, UA: NEGATIVE
Glucose, UA: NEGATIVE
Ketones, UA: NEGATIVE
NITRITE UA: NEGATIVE
PH UA: 6
PROTEIN UA: 100
Spec Grav, UA: 1.02
UROBILINOGEN UA: 0.2

## 2014-08-26 LAB — POCT URINE PREGNANCY: Preg Test, Ur: NEGATIVE

## 2014-08-26 MED ORDER — VENLAFAXINE HCL ER 37.5 MG PO CP24: ORAL_CAPSULE | ORAL | Status: DC

## 2014-08-26 MED ORDER — NITROFURANTOIN MONOHYD MACRO 100 MG PO CAPS: 100.0000 mg | ORAL_CAPSULE | Freq: Two times a day (BID) | ORAL | Status: DC

## 2014-08-26 NOTE — Progress Notes (Signed)
Subjective:    Patient ID: Rhonda Key, female    DOB: 28-Feb-1986, 28 y.o.   MRN: [id number]  HPI Patient presents today to establish care. She had back surgery 10/23/12, 02/05/14 (L5-S1 microdiscectomy) and continues to have chronic pain and worsening periods. She is bothered by pain daily. She sits all day at a computer. She feels like she has plateaued. Has back, knee, vaginal, leg pain intermittently/unusual sensation. Has sensation of tingling and feels wet. Never knows when it will come on, but has pain daily. Feels very fatigued. Sits at a desk all day which seems to exacerbate discomfort. Had minimal PT following surgery, mostly core strengthening. Has started going to the gym recently and feels better with exercise.   Does not feel like sleep is restful. Can not calm her brain. Has non aura migraines, these have increased with her stress/anxiety. Feels like she can't wind down. She is happy in her job, her sons (ages 5,6) are doing well. Feels as though there is not a specific stressor that should cause her so much anxiety.   [facility] month ago[facility] Has had spotting.   Having some irritation with urination. Didn't void after intercourse recently and thinks she might have UTI.   Past Medical History  Diagnosis Date  . Acute back pain with sciatica   . Migraine   . Numbness and tingling of right leg    Past Surgical History  Procedure Laterality Date  . Dilation and curettage of uterus    . Lumbar laminectomy/decompression microdiscectomy  10/23/2012    Procedure: LUMBAR LAMINECTOMY/DECOMPRESSION MICRODISCECTOMY 1 LEVEL;  Surgeon: Rhonda Alertavid S Jones, MD;  Location: [facility];  Service: Neurosurgery;  Laterality: Right;  Right Lumbar five-Sacral one microdiscectomy  . Back surgery    . Wisdom tooth extraction    . Lumbar laminectomy/decompression microdiscectomy N/A 02/05/2013    Procedure: REDO LUMBAR FIVE SACRAL ONE MICRODISKECTOMY, RIGHT;   Surgeon: Rhonda Alertavid S Jones, MD;  Location: [facility];  Service: Neurosurgery;  Laterality: N/A;  LUMBAR LAMINECTOMY/DECOMPRESSION MICRODISCECTOMY 1 LEVEL   No family history on file. History  Substance Use Topics  . Smoking status: Current Every Day Smoker -- 0.50 packs/day for 5 years    Types: Cigarettes  . Smokeless tobacco: Not on file  . Alcohol Use: No    Review of Systems  Constitutional: Positive for fatigue. Negative for fever and chills.  Respiratory: Negative for cough and shortness of breath.   Cardiovascular: Negative for chest pain.  Gastrointestinal: Negative for nausea, vomiting and abdominal pain.  Genitourinary: Positive for dysuria, urgency, frequency and vaginal bleeding. Negative for hematuria, vaginal discharge and difficulty urinating.  Musculoskeletal: Positive for back pain. Negative for gait problem.  Neurological: Positive for numbness and headaches.  Psychiatric/Behavioral: Positive for sleep disturbance and agitation. Negative for suicidal ideas and self-injury. The patient is nervous/anxious.       Objective:   Physical Exam  Constitutional: She is oriented to person, place, and time. She appears well-developed and well-nourished.  HENT:  Head: Normocephalic and atraumatic.  Eyes: Conjunctivae are normal. Pupils are equal, round, and reactive to light.  Neck: Normal range of motion. Neck supple.  Cardiovascular: Normal rate, regular rhythm and normal heart sounds.   Pulmonary/Chest: Effort normal and breath sounds normal.  Abdominal: Soft. Bowel sounds are normal. She exhibits no distension and no mass. There is no tenderness. There is no rebound, no guarding and no CVA tenderness.  Lymphadenopathy:  She has no cervical adenopathy.  Neurological: She is alert and oriented to person, place, and time.  Skin: Skin is warm and dry.  Psychiatric: She has a normal mood and affect. Her behavior is normal. Judgment and thought content normal.  Vitals  reviewed. BP 106/68 mmHg  Pulse 65  Temp(Src) 98.5 F (36.9 C) (Oral)  Resp 16  Ht 5' 9.75" (1.772 m)  Wt 179 lb 9.6 oz (81.466 kg)  BMI 25.94 kg/m2  SpO2 99%  LMP 08/18/2014    Assessment & Plan:  1. Urinary urgency - POCT urinalysis dipstick - POCT urine pregnancy - POCT UA - Microscopic Only  2. Generalized anxiety disorder - venlafaxine XR (EFFEXOR XR) 37.5 MG 24 hr capsule; Take 1 tablet daily for 2 weeks, then 2 tablets daily.  Dispense: 60 capsule; Refill: 0 - discussed sleep hygiene, can try melatonin nightly - follow up in 1 month, sooner if worsening symptoms  3. Cystitis - nitrofurantoin, macrocrystal-monohydrate, (MACROBID) 100 MG capsule; Take 1 capsule (100 mg total) by mouth 2 (two) times daily.  Dispense: 14 capsule; Refill: 0  4. Chronic radicular low back pain - Ambulatory referral to Neurosurgery- Dr. Murray Key  5. Need for hepatitis B screening test- patient will be traveling out of the country in the coming months - Hepatitis B surface antibody; Future  Rhonda Belfasteborah B. Tashan Kreitzer, FNP-BC  [facility]  08/28/2014 5:13 PM

## 2014-08-26 NOTE — Patient Instructions (Signed)
Please try to exercise at least every other day Try Melatonin (over the counter) nightly for 2 weeks for sleep  Generalized Anxiety Disorder Generalized anxiety disorder (GAD) is a mental disorder. It interferes with life functions, including relationships, work, and school. GAD is different from normal anxiety, which everyone experiences at some point in their lives in response to specific life events and activities. Normal anxiety actually helps us prepare for and get through these life events and activities. Normal anxiety goes away after the event or activity is over.  GAD causes anxiety that is not necessarily related to specific events or activities. It also causes excess anxiety in proportion to specific events or activities. The anxiety associated with GAD is also difficult to control. GAD can vary from mild to severe. People with severe GAD can have intense waves of anxiety with physical symptoms (panic attacks).  SYMPTOMS The anxiety and worry associated with GAD are difficult to control. This anxiety and worry are related to many life events and activities and also occur more days than not for 6 months or longer. People with GAD also have three or more of the following symptoms (one or more in children):  Restlessness.   Fatigue.  Difficulty concentrating.   Irritability.  Muscle tension.  Difficulty sleeping or unsatisfying sleep. DIAGNOSIS GAD is diagnosed through an assessment by your health care provider. Your health care provider will ask you questions aboutyour mood,physical symptoms, and events in your life. Your health care provider may ask you about your medical history and use of alcohol or drugs, including prescription medicines. Your health care provider may also do a physical exam and blood tests. Certain medical conditions and the use of certain substances can cause symptoms similar to those associated with GAD. Your health care provider may refer you to a mental  health specialist for further evaluation. TREATMENT The following therapies are usually used to treat GAD:   Medication. Antidepressant medication usually is prescribed for long-term daily control. Antianxiety medicines may be added in severe cases, especially when panic attacks occur.   Talk therapy (psychotherapy). Certain types of talk therapy can be helpful in treating GAD by providing support, education, and guidance. A form of talk therapy called cognitive behavioral therapy can teach you healthy ways to think about and react to daily life events and activities.  Stress managementtechniques. These include yoga, meditation, and exercise and can be very helpful when they are practiced regularly. A mental health specialist can help determine which treatment is best for you. Some people see improvement with one therapy. However, other people require a combination of therapies. Document Released: 12/30/2012 Document Revised: 01/19/2014 Document Reviewed: 12/30/2012 [facility] Patient Information 2015 [place] [place]. This information is not intended to replace advice given to you by your health care provider. Make sure you discuss any questions you have with your health care provider.

## 2015-07-19 ENCOUNTER — Ambulatory Visit (INDEPENDENT_AMBULATORY_CARE_PROVIDER_SITE_OTHER): Payer: BLUE CROSS/BLUE SHIELD

## 2015-07-19 ENCOUNTER — Ambulatory Visit (INDEPENDENT_AMBULATORY_CARE_PROVIDER_SITE_OTHER): Payer: BLUE CROSS/BLUE SHIELD | Admitting: Internal Medicine

## 2015-07-19 VITALS — BP 124/76 | HR 80 | Temp 98.2°F | Resp 17 | Ht 69.5 in | Wt 189.0 lb

## 2015-07-19 DIAGNOSIS — R0789 Other chest pain: Secondary | ICD-10-CM

## 2015-07-19 DIAGNOSIS — R0782 Intercostal pain: Secondary | ICD-10-CM

## 2015-07-19 MED ORDER — MELOXICAM 15 MG PO TABS: 15.0000 mg | ORAL_TABLET | Freq: Every day | ORAL | Status: DC

## 2015-07-19 MED ORDER — CYCLOBENZAPRINE HCL 10 MG PO TABS: 10.0000 mg | ORAL_TABLET | Freq: Every day | ORAL | Status: DC

## 2015-07-19 NOTE — Progress Notes (Signed)
Subjective:  This chart was scribed for Rhonda Siaobert Daryus Sowash, MD by Andrew Auaven Small, ED Scribe. This patient was seen in room 8 and the patient's care was started at 9:26 AM.   Patient ID: Rhonda Key, female    DOB: [birth year], 29 y.o.   MRN: [id number]  HPI   Chief Complaint  Patient presents with  . Chest Pain    rib pain with no traumas    HPI Comments: Rhonda Key is a 29 y.o. female who [facility] complaining of left rib pain.  Pt states she felt a pull in her left lower back a couple days ago that has radiated to her left side and left rib. She has pain to this area when lying on her left side and with certain movement and will occasionally have grabbing pain and muscle spasm in left rib with movement and changing positions. She has hx of 2 spinal surgery, L5. She denies fever, sweats, cough, SOB, palpitations, dysuria, and frequency.  Pt smokes about 1 pack a day. Pt is a single mom of 2 children, 6 and 7, and often feels overwhelmed and anxious  Past Medical History  Diagnosis Date  . Acute back pain with sciatica   . Migraine   . Numbness and tingling of right leg     -  Smoker  Prior to Admission medications   Not on File   Review of Systems  Constitutional: Negative for fever, chills and diaphoresis.  Respiratory: Negative for cough and shortness of breath.   Cardiovascular: Negative for chest pain and palpitations.  Genitourinary: Negative for dysuria, urgency, frequency and difficulty urinating.  Musculoskeletal: Positive for myalgias and back pain.   Objective:   Physical Exam  Constitutional: She is oriented to person, place, and time. She appears well-developed and well-nourished. No distress.  HENT:  Head: Normocephalic and atraumatic.  Eyes: Conjunctivae and EOM are normal.  Neck: Neck supple.  Cardiovascular: Normal rate, regular rhythm and normal heart sounds.   No murmur heard. Pulmonary/Chest: Effort normal and  breath sounds normal. She has no wheezes. She has no rales.  Musculoskeletal: Normal range of motion.  Cannot reproduce pain with pressure or ROM to thoracic, lumbar spine or rib cage.   Neurological: She is alert and oriented to person, place, and time.  Skin: Skin is warm and dry.  Psychiatric: She has a normal mood and affect. Her behavior is normal.  Nursing note and vitals reviewed.  Filed Vitals:   07/19/15 0911  BP: 124/76  Pulse: 80  Temp: 98.2 F (36.8 C)  TempSrc: Oral  Resp: 17  Height: 5' 9.5" (1.765 m)  Weight: 189 lb (85.73 kg)  SpO2: 98%   [facility] reading (PRIMARY) by Dr. Merla Richesoolittle. CXR Chest is clear. There are no obvious bony lesions.   Assessment & Plan:   1. Intercostal pain   probable muscle strain  Stretching heat Meds ordered this encounter  Medications  . meloxicam (MOBIC) 15 MG tablet    Sig: Take 1 tablet (15 mg total) by mouth daily.    Dispense:  30 tablet    Refill:  0  . cyclobenzaprine (FLEXERIL) 10 MG tablet    Sig: Take 1 tablet (10 mg total) by mouth at bedtime.    Dispense:  15 tablet    Refill:  0  If not better 3-5 days f/u to consider scanning  By signing my name below, I, Raven Small, attest that this documentation  has been prepared under the direction and in the presence of Rhonda Sia, MD.  Electronically Signed: Andrew Au, ED Scribe. 07/19/2015. 10:10 AM.  I have completed the patient encounter in its entirety as documented by the scribe, with editing by me where necessary. Catherine Oak P. Merla Riches, M.D.

## 2015-11-30 ENCOUNTER — Ambulatory Visit (INDEPENDENT_AMBULATORY_CARE_PROVIDER_SITE_OTHER): Payer: BLUE CROSS/BLUE SHIELD | Admitting: Physician Assistant

## 2015-11-30 VITALS — BP 122/60 | HR 67 | Temp 98.0°F | Resp 16 | Ht 69.0 in | Wt 193.0 lb

## 2015-11-30 DIAGNOSIS — G43709 Chronic migraine without aura, not intractable, without status migrainosus: Secondary | ICD-10-CM

## 2015-11-30 DIAGNOSIS — G44209 Tension-type headache, unspecified, not intractable: Secondary | ICD-10-CM | POA: Diagnosis not present

## 2015-11-30 MED ORDER — AMITRIPTYLINE HCL 10 MG PO TABS: 10.0000 mg | ORAL_TABLET | Freq: Every day | ORAL | Status: DC

## 2015-11-30 MED ORDER — KETOROLAC TROMETHAMINE 30 MG/ML IJ SOLN
30.0000 mg | Freq: Once | INTRAMUSCULAR | Status: AC
  Administered 2015-11-30: 30 mg via INTRAMUSCULAR

## 2015-11-30 NOTE — Progress Notes (Signed)
   Subjective:    Patient ID: Rhonda Key, female    DOB: 07/04/1986, 30 y.o.   MRN: [id number]  Chief Complaint  Patient presents with  . Headache    x 6 years  . Ear Pain    rt. x 2-3 months    Medications, allergies, past medical history, surgical history, family history, social history and problem list reviewed and updated.  HPI  [age] yof presents with recurrent headaches and right ear pain.   Per pt she has had daily headaches since 2011. Typically motion sets them off, for example if she's stretching, coughing, or straining. Sometimes she can go days between headaches but oftentimes has them multiple times per day. Lasting anywhere from seconds to minutes.   Denies any aura. Denies photophobia but admits to sometimes assoc phonophobia. Headaches are typically left temporal region. Denies assoc fevers, chills, vision changes, neck stiffness, nausea or vomiting.   She saw neurology one year ago and states she was diagnosed with migraines. Was told to avoid caffeine and nicotine. She takes tylenol almost daily which helps to relieve the headaches. Has tried multiple otc agents none of which have worked better than tylenol.   Has had assoc right ear discomfort with the headaches past few months. No hearing changes or tinnitus.   Current headache is mild, 6/10 and right sided. No photo/phonophobia. No aura. No vision changes. Not worst HA of life.   Review of Systems See HPI.     Objective:   Physical Exam  Constitutional: She is oriented to person, place, and time. She appears well-developed and well-nourished.  Non-toxic appearance. She does not have a sickly appearance. She does not appear ill. No distress.  BP 122/60 mmHg  Pulse 67  Temp(Src) 98 F (36.7 C) (Oral)  Resp 16  Ht 5\' 9"  (1.753 m)  Wt 193 lb (87.544 kg)  BMI 28.49 kg/m2  SpO2 98%  LMP 11/02/2015 (Approximate)   HENT:  Right Ear: Tympanic membrane normal. No swelling or tenderness. No mastoid tenderness.  Tympanic membrane is not erythematous. No middle ear effusion.  Left Ear: Tympanic membrane normal.  Eyes: Conjunctivae and EOM are normal. Pupils are equal, round, and reactive to light.  Neck: No Brudzinski's sign noted.  Lymphadenopathy:       Head (right side): No submental, no submandibular and no tonsillar adenopathy present.       Head (left side): No submental, no submandibular and no tonsillar adenopathy present.    She has no cervical adenopathy.  Neurological: She is alert and oriented to person, place, and time. She has normal strength. No cranial nerve deficit or sensory deficit.      Assessment & Plan:   Tension-type headache, not intractable, unspecified chronicity pattern - Plan: ketorolac (TORADOL) 30 MG/ML injection 30 mg  Chronic migraine without aura without status migrainosus, not intractable - Plan: amitriptyline (ELAVIL) 10 MG tablet, Ambulatory referral to Neurology --toradol in clinic --continue tylenol prn --unclear if headaches are actually migraines as some typical but some atypical features, doubt SAH or meningitis as headaches have been intermittent for years --will start amitriptyline as preventative measure, 3 month supply given qhs --referral placed to neurology for re-evaluation, will leave possible refill of amitriptyline or another preventative med to their recommendation  Donnajean Lopesodd M. Elester Apodaca, PA-C Physician Assistant-Certified [facility]  11/30/2015 8:25 PM

## 2015-11-30 NOTE — Patient Instructions (Addendum)
You have an unclear presentation with your recurrent headaches, they could be recurrent migraines but I am not positive on this.  You received a 30 mg injection of Toradol in clinic today.  Please start taking the amitriptyline nightly, this is an attempt to prevent the headaches.  We have referred you to neurology, you should be contacted with an appointment in the near future.    Migraine Headache A migraine headache is an intense, throbbing pain on one or both sides of your head. A migraine can last for 30 minutes to several hours. CAUSES  The exact cause of a migraine headache is not always known. However, a migraine may be caused when nerves in the brain become irritated and release chemicals that cause inflammation. This causes pain. Certain things may also trigger migraines, such as:  Alcohol.  Smoking.  Stress.  Menstruation.  Aged cheeses.  Foods or drinks that contain nitrates, glutamate, aspartame, or tyramine.  Lack of sleep.  Chocolate.  Caffeine.  Hunger.  Physical exertion.  Fatigue.  Medicines used to treat chest pain (nitroglycerine), birth control pills, estrogen, and some blood pressure medicines. SIGNS AND SYMPTOMS  Pain on one or both sides of your head.  Pulsating or throbbing pain.  Severe pain that prevents daily activities.  Pain that is aggravated by any physical activity.  Nausea, vomiting, or both.  Dizziness.  Pain with exposure to bright lights, loud noises, or activity.  General sensitivity to bright lights, loud noises, or smells. Before you get a migraine, you may get warning signs that a migraine is coming (aura). An aura may include:  Seeing flashing lights.  Seeing bright spots, halos, or zigzag lines.  Having tunnel vision or blurred vision.  Having feelings of numbness or tingling.  Having trouble talking.  Having muscle weakness. DIAGNOSIS  A migraine headache is often diagnosed based  on:  Symptoms.  Physical exam.  A CT scan or MRI of your head. These imaging tests cannot diagnose migraines, but they can help rule out other causes of headaches. TREATMENT Medicines may be given for pain and nausea. Medicines can also be given to help prevent recurrent migraines.  HOME CARE INSTRUCTIONS  Only take over-the-counter or prescription medicines for pain or discomfort as directed by your health care provider. The use of long-term narcotics is not recommended.  Lie down in a dark, quiet room when you have a migraine.  Keep a journal to find out what may trigger your migraine headaches. For example, write down:  What you eat and drink.  How much sleep you get.  Any change to your diet or medicines.  Limit alcohol consumption.  Quit smoking if you smoke.  Get 7-9 hours of sleep, or as recommended by your health care provider.  Limit stress.  Keep lights dim if bright lights bother you and make your migraines worse. SEEK IMMEDIATE MEDICAL CARE IF:   Your migraine becomes severe.  You have a fever.  You have a stiff neck.  You have vision loss.  You have muscular weakness or loss of muscle control.  You start losing your balance or have trouble walking.  You feel faint or pass out.  You have severe symptoms that are different from your first symptoms. MAKE SURE YOU:   Understand these instructions.  Will watch your condition.  Will get help right away if you are not doing well or get worse.   This information is not intended to replace advice given to you by your  health care provider. Make sure you discuss any questions you have with your health care provider.   Document Released: 09/04/2005 Document Revised: 09/25/2014 Document Reviewed: 05/12/2013 Elsevier Interactive Patient Education [employer] Elsevier Inc.

## 2015-12-27 ENCOUNTER — Ambulatory Visit (INDEPENDENT_AMBULATORY_CARE_PROVIDER_SITE_OTHER): Payer: BLUE CROSS/BLUE SHIELD | Admitting: Urgent Care

## 2015-12-27 VITALS — BP 118/70 | HR 88 | Temp 98.3°F | Resp 16 | Ht 69.0 in | Wt 195.8 lb

## 2015-12-27 DIAGNOSIS — L02416 Cutaneous abscess of left lower limb: Secondary | ICD-10-CM

## 2015-12-27 DIAGNOSIS — M79652 Pain in left thigh: Secondary | ICD-10-CM | POA: Diagnosis not present

## 2015-12-27 MED ORDER — TRAMADOL HCL 50 MG PO TABS: 50.0000 mg | ORAL_TABLET | Freq: Three times a day (TID) | ORAL | Status: DC | PRN

## 2015-12-27 MED ORDER — SULFAMETHOXAZOLE-TRIMETHOPRIM 800-160 MG PO TABS: 1.0000 | ORAL_TABLET | Freq: Two times a day (BID) | ORAL | Status: DC

## 2015-12-27 NOTE — Patient Instructions (Signed)
Incision and Drainage Incision and drainage is a procedure in which a sac-like structure (cystic structure) is opened and drained. The area to be drained usually contains material such as pus, fluid, or blood.  LET YOUR CAREGIVER KNOW ABOUT:   Allergies to medicine.  Medicines taken, including vitamins, herbs, eyedrops, over-the-counter medicines, and creams.  Use of steroids (by mouth or creams).  Previous problems with anesthetics or numbing medicines.  History of bleeding problems or blood clots.  Previous surgery.  Other health problems, including diabetes and kidney problems.  Possibility of pregnancy, if this applies. RISKS AND COMPLICATIONS  Pain.  Bleeding.  Scarring.  Infection. BEFORE THE PROCEDURE  You may need to have an ultrasound or other imaging tests to see how large or deep your cystic structure is. Blood tests may also be used to determine if you have an infection or how severe the infection is. You may need to have a tetanus shot. PROCEDURE  The affected area is cleaned with a cleaning fluid. The cyst area will then be numbed with a medicine (local anesthetic). A small incision will be made in the cystic structure. A syringe or catheter may be used to drain the contents of the cystic structure, or the contents may be squeezed out. The area will then be flushed with a cleansing solution. After cleansing the area, it is often gently packed with a gauze or another wound dressing. Once it is packed, it will be covered with gauze and tape or some other type of wound dressing. AFTER THE PROCEDURE   Often, you will be allowed to go home right after the procedure.  You may be given antibiotic medicine to prevent or heal an infection.  If the area was packed with gauze or some other wound dressing, you will likely need to come back in 1 to 2 days to get it removed.  The area should heal in about 14 days.   This information is not intended to replace advice given  to you by your health care provider. Make sure you discuss any questions you have with your health care provider.   Document Released: 02/28/2001 Document Revised: 03/05/2012 Document Reviewed: 10/30/2011 Elsevier Interactive Patient Education 2016 Elsevier Inc.  

## 2015-12-27 NOTE — Progress Notes (Signed)
    MRN: [id number] DOB: 04/04/1986  Subjective:   Rhonda Key is a 30 y.o. female presenting for chief complaint of abscess  Reports 2 day history of red, painful bump near her vagina. She has had this problem before, associated with shaving. Previously, these bumps have gone away on their own or with manual expression. Denies fever, n/v, abdominal pain.   Rhonda Key has a current medication list which includes the following prescription(s): amitriptyline and baclofen. Also has No Known Allergies.  Rhonda Key  has a past medical history of Acute back pain with sciatica; Migraine; and Numbness and tingling of right leg. Also  has past surgical history that includes Dilation and curettage of uterus; Lumbar laminectomy/decompression microdiscectomy (10/23/2012); Back surgery; Wisdom tooth extraction; and Lumbar laminectomy/decompression microdiscectomy (N/A, 02/05/2013).  Objective:   Vitals: BP 118/70 mmHg  Pulse 88  Temp(Src) 98.3 F (36.8 C) (Oral)  Resp 16  Ht 5\' 9"  (1.753 m)  Wt 195 lb 12.8 oz (88.814 kg)  BMI 28.90 kg/m2  SpO2 98%  LMP 11/02/2015 (Approximate)  Physical Exam  Constitutional: She is oriented to person, place, and time. She appears well-developed and well-nourished.  Cardiovascular: Normal rate.   Pulmonary/Chest: Effort normal.  Neurological: She is alert and oriented to person, place, and time.  Skin: Skin is warm and dry.       PROCEDURE NOTE: I&D of Abscess Verbal consent obtained. Local anesthesia with 2cc of 1% lidocaine epinephrine. Site cleansed with Betadine.  Incision of 1cm was made using a 11 blade, discharge of copious amounts of pus and serosanguinous fluid. Wound cavity was explored with curved hemostats and packed with 1/4" plain packing. Cleansed and dressed.  Assessment and Plan :   1. Abscess of left thigh 2. Pain of left thigh - Wound culture pending, start Septra. - After care instructions provided. Patient to return to clinic on  12/29/2015 for reevaluation/repacking.  Wallis BambergMario Joan Avetisyan, PA-C [facility] [phone] 12/27/2015 6:00 PM

## 2015-12-29 ENCOUNTER — Ambulatory Visit (INDEPENDENT_AMBULATORY_CARE_PROVIDER_SITE_OTHER): Payer: BLUE CROSS/BLUE SHIELD | Admitting: Urgent Care

## 2015-12-29 VITALS — BP 118/80 | HR 85 | Temp 97.9°F | Resp 18

## 2015-12-29 DIAGNOSIS — M79652 Pain in left thigh: Secondary | ICD-10-CM

## 2015-12-29 DIAGNOSIS — L02416 Cutaneous abscess of left lower limb: Secondary | ICD-10-CM

## 2015-12-29 NOTE — Progress Notes (Signed)
    MRN: [id number] DOB: 12-15-1985  Subjective:   Rhonda Key is a 30 y.o. female presenting for follow up on abscess.   Patient was seen 12/27/2015, had an I&D performed for a inner thigh abscess. Today, she reports significant improvement in her pain, swelling. She has changed dressing daily, is taking doxycycline.   Rhonda Key has a current medication list which includes the following prescription(s): amitriptyline, baclofen, sulfamethoxazole-trimethoprim, and tramadol. Also has No Known Allergies.  Rhonda Key  has a past medical history of Acute back pain with sciatica; Migraine; and Numbness and tingling of right leg. Also  has past surgical history that includes Dilation and curettage of uterus; Lumbar laminectomy/decompression microdiscectomy (10/23/2012); Back surgery; Wisdom tooth extraction; and Lumbar laminectomy/decompression microdiscectomy (N/A, 02/05/2013).  Objective:   Vitals: BP 118/80 mmHg  Pulse 85  Temp(Src) 97.9 F (36.6 C) (Oral)  Resp 18  SpO2 98%  LMP 11/02/2015 (Approximate)  Physical Exam  Constitutional: She is oriented to person, place, and time. She appears well-developed and well-nourished.  Cardiovascular: Normal rate.   Pulmonary/Chest: Effort normal.  Genitourinary:     Neurological: She is alert and oriented to person, place, and time.  Skin: Skin is warm and dry.   WOUND CARE: Dressing and packing removed. Packing had some purulent material. Wound was irrigated gently with ~20cc of sterile water. There was no drainage of purulent material. Wound repacked with 1/4" packing. Dressed.   Assessment and Plan :   1. Abscess of left thigh 2. Pain of left thigh - Wound culture pending, significant improvement! Continue doxycycline. RTC in 2 days for wound care. I anticipate that she may no longer need packing or f/u.  Wallis BambergMario Sireen Halk, PA-C [facility] [phone] 12/29/2015 5:01 PM

## 2015-12-29 NOTE — Patient Instructions (Addendum)
Please change your dressings daily after you shower.     IF you received an x-ray today, you will receive an invoice from [facility]. Please contact [facility] at [phone] with questions or concerns regarding your invoice.   IF you received labwork today, you will receive an invoice from [facility]. Please contact [facility] at [phone] with questions or concerns regarding your invoice.   Our billing staff will not be able to assist you with questions regarding bills from these companies.  You will be contacted with the lab results as soon as they are available. The fastest way to get your results is to activate your My Chart account. Instructions are located on the last page of this paperwork. If you have not heard from us regarding the results in 2 weeks, please contact this office.     Incision and Drainage Incision and drainage is a procedure in which a sac-like structure (cystic structure) is opened and drained. The area to be drained usually contains material such as pus, fluid, or blood.  LET YOUR CAREGIVER KNOW ABOUT:   Allergies to medicine.  Medicines taken, including vitamins, herbs, eyedrops, over-the-counter medicines, and creams.  Use of steroids (by mouth or creams).  Previous problems with anesthetics or numbing medicines.  History of bleeding problems or blood clots.  Previous surgery.  Other health problems, including diabetes and kidney problems.  Possibility of pregnancy, if this applies. RISKS AND COMPLICATIONS  Pain.  Bleeding.  Scarring.  Infection. BEFORE THE PROCEDURE  You may need to have an ultrasound or other imaging tests to see how large or deep your cystic structure is. Blood tests may also be used to determine if you have an infection or how severe the infection is. You may need to have a tetanus shot. PROCEDURE  The affected area is cleaned with a cleaning fluid. The cyst area will then  be numbed with a medicine (local anesthetic). A small incision will be made in the cystic structure. A syringe or catheter may be used to drain the contents of the cystic structure, or the contents may be squeezed out. The area will then be flushed with a cleansing solution. After cleansing the area, it is often gently packed with a gauze or another wound dressing. Once it is packed, it will be covered with gauze and tape or some other type of wound dressing. AFTER THE PROCEDURE   Often, you will be allowed to go home right after the procedure.  You may be given antibiotic medicine to prevent or heal an infection.  If the area was packed with gauze or some other wound dressing, you will likely need to come back in 1 to 2 days to get it removed.  The area should heal in about 14 days.   This information is not intended to replace advice given to you by your health care provider. Make sure you discuss any questions you have with your health care provider.   Document Released: 02/28/2001 Document Revised: 03/05/2012 Document Reviewed: 10/30/2011 Elsevier Interactive Patient Education [employer]2016 Elsevier Inc.

## 2015-12-30 LAB — WOUND CULTURE
GRAM STAIN: NONE SEEN
Organism ID, Bacteria: NORMAL

## 2015-12-31 ENCOUNTER — Ambulatory Visit (INDEPENDENT_AMBULATORY_CARE_PROVIDER_SITE_OTHER): Payer: BLUE CROSS/BLUE SHIELD | Admitting: Family Medicine

## 2015-12-31 VITALS — BP 122/78 | HR 68 | Temp 98.0°F | Resp 16 | Ht 69.0 in | Wt 196.2 lb

## 2015-12-31 DIAGNOSIS — L03119 Cellulitis of unspecified part of limb: Secondary | ICD-10-CM

## 2015-12-31 DIAGNOSIS — L02419 Cutaneous abscess of limb, unspecified: Secondary | ICD-10-CM

## 2015-12-31 NOTE — Patient Instructions (Addendum)
     IF you received an x-ray today, you will receive an invoice from [facility]. Please contact [facility] at [phone] with questions or concerns regarding your invoice.   IF you received labwork today, you will receive an invoice from [facility]. Please contact [facility] at [phone] with questions or concerns regarding your invoice.   Our billing staff will not be able to assist you with questions regarding bills from these companies.  You will be contacted with the lab results as soon as they are available. The fastest way to get your results is to activate your My Chart account. Instructions are located on the last page of this paperwork. If you have not heard from us regarding the results in 2 weeks, please contact this office.     Continue soap and water cleansing, bandage over area, let it heal up from below.  If any increased redness, swelling, or worsening, recheck. Otherwise this should continue to heal without packing. Let us know if you have any questions.

## 2015-12-31 NOTE — Progress Notes (Addendum)
Subjective:  By signing my name below, I, Rhonda Key, attest that this documentation has been prepared under the direction and in the presence of Rhonda Staggers, MD.  Electronically Signed: Andrew Key, ED Scribe. 12/27/2015. 5:21 PM.    Patient ID: Rhonda Key, female    DOB: 1986/05/29, 29 y.o.   MRN: [id number]  HPI   Chief Complaint  Patient presents with  . Other    wound care check on inner left thigh    HPI Comments: Rhonda Key is a 31 y.o. female who [facility] care. She had an abscess to left inner thigh. I&D performed 4 days ago with copious amount of pus. Pt was started on septra. Wound culture was negative. Advised to stop septra yesterday. Wound check 2 days ago with Rhonda Key. Packing was removed irrigated re packed with quarter inch packing.   Pt states wound has been doing well. She denies pain or irritation to wound. Pt stopped abx as advised. She denies fever, chills and feeling ill.   There are no active problems to display for this patient.  Past Medical History  Diagnosis Date  . Acute back pain with sciatica   . Migraine   . Numbness and tingling of right leg    Past Surgical History  Procedure Laterality Date  . Dilation and curettage of uterus    . Lumbar laminectomy/decompression microdiscectomy  10/23/2012    Procedure: LUMBAR LAMINECTOMY/DECOMPRESSION MICRODISCECTOMY 1 LEVEL;  Surgeon: Tia Alert, MD;  Location: [facility];  Service: Neurosurgery;  Laterality: Right;  Right Lumbar five-Sacral one microdiscectomy  . Back surgery    . Wisdom tooth extraction    . Lumbar laminectomy/decompression microdiscectomy N/A 02/05/2013    Procedure: REDO LUMBAR FIVE SACRAL ONE MICRODISKECTOMY, RIGHT;  Surgeon: Tia Alert, MD;  Location: [facility];  Service: Neurosurgery;  Laterality: N/A;  LUMBAR LAMINECTOMY/DECOMPRESSION MICRODISCECTOMY 1 LEVEL   No Known Allergies Prior to Admission medications     Medication Sig Start Date End Date Taking? Authorizing Provider  amitriptyline (ELAVIL) 10 MG tablet Take 1 tablet (10 mg total) by mouth at bedtime. 11/30/15  Yes Todd McVeigh, PA  traMADol (ULTRAM) 50 MG tablet Take 1 tablet (50 mg total) by mouth every 8 (eight) hours as needed. Patient not taking: Reported on 12/31/2015 12/27/15   Rhonda Bamberg, PA-C   Social History   Social History  . Marital Status: Single    Spouse Name: N/A  . Number of Children: N/A  . Years of Education: N/A   Occupational History  . Not on file.   Social History Main Topics  . Smoking status: Current Every Day Smoker -- 1.00 packs/day for 8 years    Types: Cigarettes  . Smokeless tobacco: Never Used  . Alcohol Use: No  . Drug Use: Yes    Special: Marijuana  . Sexual Activity: Yes    Birth Control/ Protection: None   Other Topics Concern  . Not on file   Social History Narrative   Review of Systems  Constitutional: Negative for fever, chills and diaphoresis.  Musculoskeletal: Negative for myalgias.  Skin: Positive for wound. Negative for color change.    Objective:   Physical Exam  Constitutional: She is oriented to person, place, and time. She appears well-developed and well-nourished. No distress.  HENT:  Head: Normocephalic and atraumatic.  Eyes: Conjunctivae and EOM are normal.  Neck: Neck supple.  Cardiovascular: Normal rate.  Pulmonary/Chest: Effort normal.  Musculoskeletal: Normal range of motion.  Neurological: She is alert and oriented to person, place, and time.  Skin: Skin is warm and dry.  Left proximal medial thigh lateral to labia packing intact no surround erythema no significant induration Opening is about 4mm across 2mm the other way. base is visualized. No exudate seen.    Psychiatric: She has a normal mood and affect. Her behavior is normal.  Nursing note and vitals reviewed.    Filed Vitals:   12/31/15 1659  BP: 122/78  Pulse: 68  Temp: 98 F (36.7 C)  Resp: 16     Assessment & Plan:  Rhonda FilesShannon R Key is a 30 y.o. female Cellulitis and abscess of leg  -healing well. There is shallow opening at this time.    -leave packing out, continue bandage as needed, routine soap and water cleansing, RTC precautions if not continuing to heal.   No orders of the defined types were placed in this encounter.   Patient Instructions       IF you received an x-ray today, you will receive an invoice from [facility]. Please contact [facility] at [phone] with questions or concerns regarding your invoice.   IF you received labwork today, you will receive an invoice from [facility]. Please contact [facility] at [phone] with questions or concerns regarding your invoice.   Our billing staff will not be able to assist you with questions regarding bills from these companies.  You will be contacted with the lab results as soon as they are available. The fastest way to get your results is to activate your My Chart account. Instructions are located on the last page of this paperwork. If you have not heard from us regarding the results in 2 weeks, please contact this office.     Continue soap and water cleansing, bandage over area, let it heal up from below.  If any increased redness, swelling, or worsening, recheck. Otherwise this should continue to heal without packing. Let us know if you have any questions.   I personally performed the services described in this documentation, which was scribed in my presence. The recorded information has been reviewed and considered, and addended by me as needed.

## 2016-01-02 ENCOUNTER — Encounter: Payer: Self-pay | Admitting: Family Medicine

## 2016-01-03 ENCOUNTER — Ambulatory Visit (INDEPENDENT_AMBULATORY_CARE_PROVIDER_SITE_OTHER): Payer: BLUE CROSS/BLUE SHIELD | Admitting: Neurology

## 2016-01-03 ENCOUNTER — Encounter: Payer: Self-pay | Admitting: Neurology

## 2016-01-03 VITALS — BP 124/78 | HR 78 | Ht 69.75 in | Wt 195.0 lb

## 2016-01-03 DIAGNOSIS — R208 Other disturbances of skin sensation: Secondary | ICD-10-CM

## 2016-01-03 DIAGNOSIS — R200 Anesthesia of skin: Secondary | ICD-10-CM

## 2016-01-03 DIAGNOSIS — R202 Paresthesia of skin: Secondary | ICD-10-CM | POA: Diagnosis not present

## 2016-01-03 DIAGNOSIS — G4484 Primary exertional headache: Secondary | ICD-10-CM

## 2016-01-03 DIAGNOSIS — Z720 Tobacco use: Secondary | ICD-10-CM

## 2016-01-03 DIAGNOSIS — R51 Headache: Secondary | ICD-10-CM | POA: Diagnosis not present

## 2016-01-03 MED ORDER — AMITRIPTYLINE HCL 25 MG PO TABS: 25.0000 mg | ORAL_TABLET | Freq: Every day | ORAL | Status: DC

## 2016-01-03 NOTE — Patient Instructions (Signed)
1.  I increased the amitriptyline to 25mg  at bedtime.  Give this dose 4 weeks.  Contact me in 4 weeks with update.  If headaches not improved or only mildly improved, we can increase dose to 50mg  at bedtime. 2.  Limit use of Tylenol or Excedrin to no more than 2 days out of the week to prevent rebound headache 3.  Will check MRI of brain with and without contrast and and MRA of head. 4.  Follow up in 3 months. 5.  Stop smoking!

## 2016-01-03 NOTE — Progress Notes (Signed)
NEUROLOGY CONSULTATION NOTE  Rhonda FilesShannon R Key MRN: [id number] DOB: 1986/03/24  Referring provider: Raelyn Ensignodd McVeigh, PA Primary care provider: no PCP  Reason for consult:  headache  HISTORY OF PRESENT ILLNESS: Rhonda Key is a 30 year old right-handed female with history of L5-S1 surgery x2 who presents for headache.  History obtained by patient and provider note.  Onset:  2011 Location:  Top of head, either side, frontal Quality:  Pressure, sharp stabbing.   Intensity:  10/10 Aura:  no Prodrome:  no Associated symptoms:  Sometimes photophobia or phonophobia.  No nausea or visual disturbance such as vision loss, blurred vision, or diplopia. Duration:  From 3 seconds to 30 minutes Frequency:  Several times a day. Triggers/exacerbating factors:  Initially it only occurred with change in position (such as bending over) or exertion (cough, exercise, valsalva).  Then, it would occur spontaneously but these activities still trigger or exacerbate it. Relieving factors:  Laying flat. Activity:  Lays down When the pain is lessened, she notes numbness and tingling involving the lower half of her face bilaterally, as well as lower arms and hands.  She denies neck pain or radicular pain down the arms.  She has some residual numbness and bladder dysfunction from lumbosacral surgery, but nothing new.  Past NSAIDS:  Advil, Aleve Past analgesics:  Marlin CanaryGoody Past abortive triptans:  no Past muscle relaxants:  Baclofen Past anti-emetic:  no Past anti-anxiolytic:  no Past sleep aide:  no Past antihypertensive medications:  no Past antidepressant medications:  no Past anticonvulsant medications:  no Past vitamins/Herbal/Supplements:  no Past antihistamines/decongestants:  no Other past medications:  no  Current NSAIDS:  none Current analgesics:  Acetaminophen, Excedrin  Current triptans:  no Current anti-emetic:  no Current muscle relaxants:  no Current anti-anxiolytic:  no Current sleep  aide:  no Current Antihypertensive medications:  no Current Antidepressant medications:  Amitriptyline 10mg  (restarted it a month ago.  She was previously on it up to 75mg  daily for nerve pain following her surgeries in 2014.  Headaches may have been less severe during that time). Current Anticonvulsant medications:  no Current Vitamins/Herbal/Supplements:  no Current Antihistamines/Decongestants:  no Other therapy:  no  Caffeine:  She tried stopping caffeine for a year and it didn't help.  She drinks soda again Alcohol:  no Smoker:  yes Diet:  hydrates Exercise:  no Depression/stress:  yes Sleep hygiene:  good Family history of headache:  Mom had headaches  PAST MEDICAL HISTORY: Past Medical History  Diagnosis Date  . Acute back pain with sciatica   . Migraine   . Numbness and tingling of right leg     PAST SURGICAL HISTORY: Past Surgical History  Procedure Laterality Date  . Dilation and curettage of uterus    . Lumbar laminectomy/decompression microdiscectomy  10/23/2012    Procedure: LUMBAR LAMINECTOMY/DECOMPRESSION MICRODISCECTOMY 1 LEVEL;  Surgeon: Tia Alertavid S Jones, MD;  Location: [facility];  Service: Neurosurgery;  Laterality: Right;  Right Lumbar five-Sacral one microdiscectomy  . Back surgery    . Wisdom tooth extraction    . Lumbar laminectomy/decompression microdiscectomy N/A 02/05/2013    Procedure: REDO LUMBAR FIVE SACRAL ONE MICRODISKECTOMY, RIGHT;  Surgeon: Tia Alertavid S Jones, MD;  Location: [facility];  Service: Neurosurgery;  Laterality: N/A;  LUMBAR LAMINECTOMY/DECOMPRESSION MICRODISCECTOMY 1 LEVEL    MEDICATIONS: No current outpatient prescriptions on file prior to visit.   No current facility-administered medications on file prior to visit.    ALLERGIES: No Known Allergies  FAMILY HISTORY:  Family History  Problem Relation Age of Onset  . Migraines Mother     SOCIAL HISTORY: Social History   Social History  . Marital Status: Single    Spouse Name:  N/A  . Number of Children: N/A  . Years of Education: N/A   Occupational History  . Not on file.   Social History Main Topics  . Smoking status: Current Every Day Smoker -- 1.00 packs/day for 8 years    Types: Cigarettes  . Smokeless tobacco: Never Used  . Alcohol Use: No  . Drug Use: Yes    Special: Marijuana  . Sexual Activity: Yes    Birth Control/ Protection: None   Other Topics Concern  . Not on file   Social History Narrative    REVIEW OF SYSTEMS: Constitutional: No fevers, chills, or sweats, no generalized fatigue, change in appetite Eyes: No visual changes, double vision, eye pain Ear, nose and throat: No hearing loss, ear pain, nasal congestion, sore throat Cardiovascular: No chest pain, palpitations Respiratory:  No shortness of breath at rest or with exertion, wheezes GastrointestinaI: No nausea, vomiting, diarrhea, abdominal pain, fecal incontinence Genitourinary:  No dysuria, urinary retention or frequency Musculoskeletal:  No neck pain, back pain Integumentary: No rash, pruritus, skin lesions Neurological: as above Psychiatric: No depression, insomnia, anxiety Endocrine: No palpitations, fatigue, diaphoresis, mood swings, change in appetite, change in weight, increased thirst Hematologic/Lymphatic:  No anemia, purpura, petechiae. Allergic/Immunologic: no itchy/runny eyes, nasal congestion, recent allergic reactions, rashes  PHYSICAL EXAM: Filed Vitals:   01/03/16 1434  BP: 124/78  Pulse: 78   General: No acute distress.  Patient appears well-groomed.  Head:  Normocephalic/atraumatic Eyes:  fundi unremarkable, without vessel changes, exudates, hemorrhages or papilledema. Neck: supple, no paraspinal tenderness, full range of motion Back: No paraspinal tenderness Heart: regular rate and rhythm Lungs: Clear to auscultation bilaterally. Vascular: No carotid bruits. Neurological Exam: Mental status: alert and oriented to person, place, and time, recent  and remote memory intact, fund of knowledge intact, attention and concentration intact, speech fluent and not dysarthric, language intact. Cranial nerves: CN I: not tested CN II: pupils equal, round and reactive to light, visual fields intact, fundi unremarkable, without vessel changes, exudates, hemorrhages or papilledema. CN III, IV, VI:  full range of motion, no nystagmus, no ptosis CN V: facial sensation intact CN VII: upper and lower face symmetric CN VIII: hearing intact CN IX, X: gag intact, uvula midline CN XI: sternocleidomastoid and trapezius muscles intact CN XII: tongue midline Bulk & Tone: normal, no fasciculations. Motor:  5/5 throughout  Sensation: temperature and vibration sensation intact. Deep Tendon Reflexes:  2+ throughout, toes downgoing.  Finger to nose testing:  Without dysmetria.  Heel to shin:  Without dysmetria.  Gait:  Normal station and stride.  Able to turn and tandem walk. Romberg negative.  IMPRESSION: 1.  Headache, unclear etiology/syndrome.  It has characteristics of a primary exertional headache, but it is no longer completely triggered by exertion.  With accompanied lower facial numbness and arm/hand numbness, consider upper cervical etiology or Arnold-Chiari 2.  Tobacco use.  PLAN: 1.  Will check MRI and MRA of head 2.   Increase amitriptyline to  at bedtime.  We can increase to  in 4 weeks if needed (she is to contact us at that time with update) 3.  Limit use of pain relievers to no more than 2 days out of the week 4.  Smoking cessation 5.  Follow up in approximately 3 months.c  Thank you for allowing me to take part in the care of this patient.  Shon Millet, DO  CC:  Raelyn Ensign, PA

## 2016-01-04 ENCOUNTER — Telehealth: Payer: Self-pay

## 2016-01-04 NOTE — Telephone Encounter (Signed)
Referrals updated w/ authorization number.

## 2016-01-04 NOTE — Telephone Encounter (Signed)
[employer] is going to require a peer-to-peer for MRA of head. Phone number is [phone]. Pt's ID number is [id number].

## 2016-01-04 NOTE — Telephone Encounter (Signed)
I spoke with insurance co.  MRI/MRA approved starting today (01/04/16) and expires on 02/02/16.  Authorization # [id number]

## 2016-01-11 ENCOUNTER — Ambulatory Visit
Admission: RE | Admit: 2016-01-11 | Discharge: 2016-01-11 | Disposition: A | Discharge status: Home / Self Care | Payer: BLUE CROSS/BLUE SHIELD | Source: Ambulatory Visit | Attending: Neurology | Admitting: Neurology

## 2016-01-11 DIAGNOSIS — R200 Anesthesia of skin: Secondary | ICD-10-CM

## 2016-01-11 DIAGNOSIS — R202 Paresthesia of skin: Secondary | ICD-10-CM

## 2016-01-11 DIAGNOSIS — G4484 Primary exertional headache: Secondary | ICD-10-CM

## 2016-01-11 MED ORDER — GADOBENATE DIMEGLUMINE 529 MG/ML IV SOLN
18.0000 mL | Freq: Once | INTRAVENOUS | Status: AC | PRN
  Administered 2016-01-11: 18 mL via INTRAVENOUS

## 2016-01-13 ENCOUNTER — Telehealth: Payer: Self-pay

## 2016-01-13 DIAGNOSIS — R519 Headache, unspecified: Secondary | ICD-10-CM | POA: Insufficient documentation

## 2016-01-13 DIAGNOSIS — R51 Headache: Secondary | ICD-10-CM

## 2016-01-13 DIAGNOSIS — G950 Syringomyelia and syringobulbia: Secondary | ICD-10-CM

## 2016-01-13 NOTE — Telephone Encounter (Signed)
Pt scheduled for 01/17/16 at [facility] For MRI cervical spine. Ref[facility] fax[facility] to [facility].

## 2016-01-13 NOTE — Telephone Encounter (Signed)
-----   Message from Rhonda DallasAdam R Jaffe, DO sent at 01/12/2016 12:31 PM EDT ----- I called and spoke with [name].  MRI of brain shows Arnold-Chiari malformation.  I would like to refer her to neurosurgery for evaluation and possible surgery for this, as I feel it is the cause of her headaches and paresthesias.  In the meantime, I want to get an MRI of the cervical spine to look for any evidence of of syrinx (preferably to be performed prior to consult)

## 2016-01-13 NOTE — Telephone Encounter (Signed)
-----   Message from Adam R Jaffe, DO sent at 01/12/2016 12:31 PM EDT ----- I called and spoke with Ms. Billard.  MRI of brain shows Arnold-Chiari malformation.  I would like to refer her to neurosurgery for evaluation and possible surgery for this, as I feel it is the cause of her headaches and paresthesias.  In the meantime, I want to get an MRI of the cervical spine to look for any evidence of of syrinx (preferably to be performed prior to consult) 

## 2016-01-17 ENCOUNTER — Ambulatory Visit
Admission: RE | Admit: 2016-01-17 | Discharge: 2016-01-17 | Disposition: A | Discharge status: Home / Self Care | Payer: BLUE CROSS/BLUE SHIELD | Source: Ambulatory Visit | Attending: Neurology | Admitting: Neurology

## 2016-01-17 DIAGNOSIS — G950 Syringomyelia and syringobulbia: Secondary | ICD-10-CM

## 2016-01-17 MED ORDER — GADOBENATE DIMEGLUMINE 529 MG/ML IV SOLN
18.0000 mL | Freq: Once | INTRAVENOUS | Status: AC | PRN
  Administered 2016-01-17: 18 mL via INTRAVENOUS

## 2016-01-18 ENCOUNTER — Telehealth: Payer: Self-pay

## 2016-01-18 NOTE — Telephone Encounter (Signed)
-----   Message from Drema DallasAdam R Jaffe, DO sent at 01/18/2016  7:33 AM EDT ----- MRI of cervical spine shows no inflammation or swelling of the spinal cord

## 2016-01-18 NOTE — Telephone Encounter (Signed)
Pt aware. Pt did want to let us know that [facility] called to schedule her w/ Dr. Yetta BarreJones (her previous doctor there) but she wanted to see Dr. Rush FarmerSterns. They stated Dr. Yetta BarreJones had never released her so due to policy they must get it okay'd by providers. They are supposed to call us back. She just wanted to let us know because if they do not schedule her w/ Dr. Rush FarmerSterns she would like a referral elsewhere.

## 2016-01-24 ENCOUNTER — Ambulatory Visit (INDEPENDENT_AMBULATORY_CARE_PROVIDER_SITE_OTHER): Payer: BLUE CROSS/BLUE SHIELD | Admitting: Family Medicine

## 2016-01-24 ENCOUNTER — Telehealth: Payer: Self-pay | Admitting: Neurology

## 2016-01-24 VITALS — BP 135/82 | HR 70 | Temp 97.6°F | Resp 18 | Ht 69.0 in | Wt 195.0 lb

## 2016-01-24 DIAGNOSIS — R519 Headache, unspecified: Secondary | ICD-10-CM

## 2016-01-24 DIAGNOSIS — Q0700 Arnold-Chiari syndrome without spina bifida or hydrocephalus: Secondary | ICD-10-CM | POA: Insufficient documentation

## 2016-01-24 DIAGNOSIS — IMO0001 Reserved for inherently not codable concepts without codable children: Secondary | ICD-10-CM | POA: Insufficient documentation

## 2016-01-24 DIAGNOSIS — R51 Headache: Secondary | ICD-10-CM

## 2016-01-24 DIAGNOSIS — G8929 Other chronic pain: Secondary | ICD-10-CM

## 2016-01-24 DIAGNOSIS — Z3049 Encounter for surveillance of other contraceptives: Secondary | ICD-10-CM | POA: Diagnosis not present

## 2016-01-24 MED ORDER — KETOROLAC TROMETHAMINE 60 MG/2ML IM SOLN
60.0000 mg | Freq: Once | INTRAMUSCULAR | Status: AC
  Administered 2016-01-24: 60 mg via INTRAMUSCULAR

## 2016-01-24 MED ORDER — ACETAZOLAMIDE 125 MG PO TABS: 125.0000 mg | ORAL_TABLET | Freq: Two times a day (BID) | ORAL | Status: DC

## 2016-01-24 NOTE — Progress Notes (Signed)
30 yo woman with Rhonda Key malformation and chronic headaches.  She is under care of a neurologist Dr. Everlena CooperJaffe..  The malformation was diagnosed by MRI two weeks ago.    The chronic daily headache is worsening.  She has more pressure and the neck is getting stiffer.  She has not yet seen the neurosurgeon yet.  She has had unrelated spinal surgery by Dr. Yetta BarreJones.  Patient says that she was getting relief by lying down, but now the positional nature is not being relieved.  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  Associated:  Vertigo on Friday was associated with vomiting.  Significant Negatives:  Fever, nausea or vomiting.  Rx:  amitriptylline and Excedrin migraine.  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 No cord edema or syrinx. The vertebral bodies are normally aligned and demonstrate normal marrow signal. The facets are normally aligned.  C2-3: No significant findings.  C3-4: No significant findings.  C4-5: No significant findings.  C5-6: Shallow broad-based disc protrusion and mild osteophytic ridging with mass effect on the ventral thecal sac and mild narrowing of the ventral CSF space. No foraminal stenosis.  C6-7: Shallow disc osteophyte complex on the left with mild left foraminal encroachment. No spinal stenosis.  C7-T1: No significant findings.  IMPRESSION: 1. 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 2. No cord edema or syrinx. 3. Broad-based disc protrusion at C5-6. 4. Shallow disc osteophyte complex on the left at C6-7.   Electronically Signed  By: Rudie MeyerP. Gallerani M.D.  On: 01/17/2016 17:18  HEENT: unremarkable with flat discs, normal TM's Neck:  Able to touch chin to chest and look left and right Reflexes:  Normal BJ, TJ, kJ and AJ CN III-XII: normal Motor: symmetric Coordination:  Normal  gait, no tremor  Assessment:  Probable Arnold Key related headache.  Plan:  Toradol Diamox bid Encounter for surveillance of other contraceptive  Chronic intractable headache, unspecified headache type - Plan: ketorolac (TORADOL) injection 60 mg, acetaZOLAMIDE (DIAMOX) 125 MG tablet  Arnold-Key malformation (HCC) - Plan: ketorolac (TORADOL) injection 60 mg, acetaZOLAMIDE (DIAMOX) 125 MG tablet    Follow up with neurology and neurosurgery.  Rhonda SidleKurt Carmilla Granville, MD

## 2016-01-24 NOTE — Patient Instructions (Addendum)
Follow up with neurology and neurosurgery    IF you received an x-ray today, you will receive an invoice from [facility]. Please contact [facility] at [phone] with questions or concerns regarding your invoice.   IF you received labwork today, you will receive an invoice from [facility]. Please contact [facility] at [phone] with questions or concerns regarding your invoice.   Our billing staff will not be able to assist you with questions regarding bills from these companies.  You will be contacted with the lab results as soon as they are available. The fastest way to get your results is to activate your My Chart account. Instructions are located on the last page of this paperwork. If you have not heard from us regarding the results in 2 weeks, please contact this office.

## 2016-01-24 NOTE — Telephone Encounter (Signed)
Rhonda Key 30-Oct-1985. She came into the office today. She dropped off some disability forms to have filled out by Dr. Everlena CooperJaffe. She also is concerned because she is having a hard time turning her head. She is unsure of what her next step is to do.  She also said she needed the forms back within a few days. Her number is [phone]. Thank you

## 2016-01-25 ENCOUNTER — Telehealth: Payer: Self-pay | Admitting: Neurology

## 2016-01-25 NOTE — Telephone Encounter (Signed)
Just cleared out my inbox at front desk. I've not seen any paperwork for pt. Was it given to someone else while I was out?

## 2016-01-25 NOTE — Telephone Encounter (Signed)
PT called and would like a call back in regards to some paperwork and she has a couple of questions also/Dawn [phone]

## 2016-01-26 ENCOUNTER — Ambulatory Visit (INDEPENDENT_AMBULATORY_CARE_PROVIDER_SITE_OTHER): Payer: BLUE CROSS/BLUE SHIELD | Admitting: Urgent Care

## 2016-01-26 VITALS — BP 124/76 | HR 99 | Temp 97.9°F | Resp 18 | Ht 69.0 in | Wt 196.0 lb

## 2016-01-26 DIAGNOSIS — R51 Headache: Secondary | ICD-10-CM

## 2016-01-26 DIAGNOSIS — R42 Dizziness and giddiness: Secondary | ICD-10-CM

## 2016-01-26 DIAGNOSIS — R519 Headache, unspecified: Secondary | ICD-10-CM

## 2016-01-26 DIAGNOSIS — Q0700 Arnold-Chiari syndrome without spina bifida or hydrocephalus: Secondary | ICD-10-CM | POA: Diagnosis not present

## 2016-01-26 DIAGNOSIS — F411 Generalized anxiety disorder: Secondary | ICD-10-CM | POA: Diagnosis not present

## 2016-01-26 MED ORDER — MECLIZINE HCL 25 MG PO TABS: 25.0000 mg | ORAL_TABLET | Freq: Three times a day (TID) | ORAL | Status: DC | PRN

## 2016-01-26 MED ORDER — KETOROLAC TROMETHAMINE 60 MG/2ML IM SOLN
60.0000 mg | Freq: Once | INTRAMUSCULAR | Status: AC
  Administered 2016-01-26: 60 mg via INTRAMUSCULAR

## 2016-01-26 MED ORDER — LORAZEPAM 0.5 MG PO TABS: 0.5000 mg | ORAL_TABLET | Freq: Two times a day (BID) | ORAL | Status: DC | PRN

## 2016-01-26 NOTE — Progress Notes (Signed)
    MRN: [id number] DOB: [birth year]  Subjective:   Rhonda Key is a 30 y.o. female presenting for chief complaint of Follow-up  Reports 2 week history of worsening vertigo, dizziness, difficulty getting out of bed. Now having neck stiffness, neck pain. Taking Excedrin daily every 4 hours. Also taking amitriptyline daily. She was started on acetazolamide 01/24/2016 with minimal relief, feels that the Toradol injection helped. Of note, patient has a Neurosurgery consult on 02/23/2016. Has already been evaluated by Neurologist, MRI performed and confirmed Arnold-Chiari malformation. Smokes 3/4 pack per day. No alcohol use.  Rhonda Key has a current medication list which includes the following prescription(s): acetazolamide, amitriptyline, aspirin-acetaminophen-caffeine, and etonogestrel. Also has No Known Allergies.  Rhonda Key  has a past medical history of Acute back pain with sciatica; Migraine; and Numbness and tingling of right leg. Also  has past surgical history that includes Dilation and curettage of uterus; Lumbar laminectomy/decompression microdiscectomy (10/23/2012); Back surgery; Wisdom tooth extraction; and Lumbar laminectomy/decompression microdiscectomy (N/A, 02/05/2013).  Objective:   Vitals: BP 124/76 mmHg  Pulse 99  Temp(Src) 97.9 F (36.6 C) (Oral)  Resp 18  Ht 5\' 9"  (1.753 m)  Wt 196 lb (88.905 kg)  BMI 28.93 kg/m2  SpO2 97%  LMP 12/30/2015  Physical Exam  Constitutional: She is oriented to person, place, and time. She appears well-developed and well-nourished.  HENT:  Mouth/Throat: Oropharynx is clear and moist.  Eyes: EOM are normal. Pupils are equal, round, and reactive to light.  Neck: Normal range of motion. Neck supple.  Cardiovascular: Normal rate, regular rhythm and intact distal pulses.   Pulmonary/Chest: No respiratory distress. She has no wheezes. She has no rales.  Musculoskeletal: She exhibits no edema.  Neurological: She is alert and oriented to person,  place, and time. No cranial nerve deficit.  Skin: Skin is warm and dry.  Psychiatric: Her mood appears anxious.   Assessment and Plan :   1. Arnold-Chiari malformation (HCC) 2. Headache, unspecified headache type 3. Dizziness 4. Anxiety state - Stop Diamox, start lorazepam for anxiety, meclizine for dizziness. Patient states that she would like to be on FMLA. Her neurologist was unwilling to complete this paperwork due to having seen her just once.   Wallis BambergMario Sary Bogie, PA-C [facility] [phone] 01/26/2016 8:52 AM

## 2016-01-26 NOTE — Patient Instructions (Addendum)
Chiari Malformation Chiari malformation (CM) is a type of brain abnormality that involves the parts of your brain that are important for balance (cerebellum) and basic body functions (brain stem). Normally, the cerebellum is located in a space at the back part of the skull, just above the opening for the spinal cord (foramen magnum). With CM, part of the cerebellum is located below the foramen magnum instead. This can cause neck pain, balance problems, and other symptoms. There are five types of CM:  Type I.  This is the most common type. It can cause cerebrospinal fluid (CSF) to not flow between your brain and spinal cord as it normally should.  This type may not cause symptoms, and it often goes unnoticed.  Type II.  This type is present at birth (congenital), and it usually involves a condition in which the spine does not form properly (spina bifida).  Type II also involves having a larger portion of brain structures move down and push through (herniate) the foramen magnum into the spinal canal.  Type III.  This type is more severe than Types I and II, and it is the least common type.  Type III often occurs with a type of congenital disability in which an opening in the skull causes the cerebellum and brain stem and their coverings to bulge out in a sac (encephalocele).  Type IV.  This is also a severe form of CM.  Part of the cerebellum may be missing, and parts of the spine and skull may be visible.  Type 0.  In this type, the cerebellum does not herniate into or below the foramen magnum. However, abnormal flow of CSF between the brain and spinal cord creates a collection of fluid inside the spinal cord (syrinx). This may cause symptoms. CAUSES CM is most commonly congenital. CM can occur when the skull forms incorrectly and provides less space for the cerebellum than normal. In rare cases, CM may also develop later in life (acquired CM or secondary CM). These cases may be  caused by:  Injury.  Infection. Abnormal structure or pressure develops in the brain, and this pushes the cerebellum down into the foramen magnum. RISK FACTORS Congenital CM is more likely to occur in:  Females.  People who have a family history of CM. SYMPTOMS CM may not cause any symptoms. Symptoms may also vary depending on the type and severity of CM. Symptoms may also come and go. The most common symptom is a severe headache in the back of the head. Headache pain may come and go. It may also spread to your neck and shoulders. The pain may be worse when you cough, sneeze, or strain. Other symptoms include:  Difficulty balancing.  Loss of coordination.  Trouble swallowing or speaking.  Muscle weakness.  Feeling dizzy.  Ringing in the ears.  Fainting.  Trouble sleeping.  Fatigue.  Tingling or burning sensations in the fingers or toes.  Hearing problems.  Vision problems.  Vomiting.  Depression.  Seizures. These are only present in more severe types of CM. DIAGNOSIS This condition may be diagnosed with a medical history and physical exam. This may include tests to check your balance and your memory. You may also have other tests, including:  X-ray.  CT scan.  MRI. TREATMENT Treatment for this condition depends on your symptoms and the type of CM that you have. If you have headaches or neck pain, you may be treated with pain medicine or massage therapy. If you have symptoms of  CM that are severe or are getting worse, your health care provider may recommend surgery to control your symptoms and prevent the malformation from getting worse. If you do not have symptoms, you may not need treatment. HOME CARE INSTRUCTIONS  Take medicines only as directed by your health care provider.  Avoid activities that involve straining and heavy lifting.  Consider joining a CM support group.  Keep all follow-up visits as directed by your health care provider. This is  important. SEEK MEDICAL CARE IF:  You have new symptoms.  Your symptoms get worse. SEEK IMMEDIATE MEDICAL CARE IF:  You have seizures that are new or different than before.   This information is not intended to replace advice given to you by your health care provider. Make sure you discuss any questions you have with your health care provider.   Document Released: 08/25/2002 Document Revised: 01/19/2015 Document Reviewed: 05/13/2014 Elsevier Interactive Patient Education 2016 [employer]     IF you received an x-ray today, you will receive an invoice from [facility]. Please contact [facility] at [phone] with questions or concerns regarding your invoice.   IF you received labwork today, you will receive an invoice from [facility]. Please contact [facility] at [phone] with questions or concerns regarding your invoice.   Our billing staff will not be able to assist you with questions regarding bills from these companies.  You will be contacted with the lab results as soon as they are available. The fastest way to get your results is to activate your My Chart account. Instructions are located on the last page of this paperwork. If you have not heard from us regarding the results in 2 weeks, please contact this office.

## 2016-01-26 NOTE — Telephone Encounter (Signed)
Pt currently at PCP's office. States she spoke to [facility] (DR. Stern)and they can't see her til June 7th. She says she is in too much pain to wait that long. Pt states PCP gave her a shot to lower her pressure. Advised that Dr. Everlena CooperJaffe would be more comfortable with either PCP or Dr. Venetia MaxonStern filling out paperwork since those will be the treating provider's for what she is requesting FMLA for. Pt aware. Will come pick up forms. Left at front desk.

## 2016-01-27 ENCOUNTER — Telehealth: Payer: Self-pay

## 2016-01-27 NOTE — Telephone Encounter (Signed)
Patient needs FMLA forms completed by Urban GibsonMani, for her Debroah Looprnold Chiari malformation that she has, I have completed what I could from the OV notes and highlighted the areas that need to be filled in. I will place them in your box on 01/27/16 if you could please return them to the FMLA/Disability box at the 102 checkout desk within 5-7 business days. Thank you!

## 2016-01-27 NOTE — Telephone Encounter (Signed)
Hi Caitlin! I will be out of the office until 02/08/2016. Dr. Milus GlazierLauenstein saw her before I did for the same. Maybe he can help.   Thank you!

## 2016-01-27 NOTE — Telephone Encounter (Signed)
Ok thank you, I saw the schedule after the fact I will place them in his box today.

## 2016-01-28 NOTE — Telephone Encounter (Signed)
I won't be back in the office until Tuesday, May 16

## 2016-01-31 NOTE — Telephone Encounter (Signed)
That is fine the paperwork is in his box and he can complete when he comes in. Thank you

## 2016-01-31 NOTE — Telephone Encounter (Signed)
Please see comments below from Dr. Milus GlazierLauenstein. Will not be back in office until 02/01/16

## 2016-02-02 DIAGNOSIS — Z0271 Encounter for disability determination: Secondary | ICD-10-CM

## 2016-02-07 ENCOUNTER — Telehealth: Payer: Self-pay | Admitting: Family Medicine

## 2016-02-07 ENCOUNTER — Telehealth: Payer: Self-pay

## 2016-02-07 NOTE — Telephone Encounter (Signed)
Patient needs FMLA forms completed again because the dates were wrong on the first on. I have updated them and will place the new ones in Dr Cain SaupeL's box on 02/07/16 if you could please sign them and return them to the FMLA/Disability box at the 102 checkout desk within 5-7 business days. Thank you!

## 2016-02-07 NOTE — Telephone Encounter (Signed)
Pt called stating that its hard for her to move her neck patient will come into office to see Rhonda Key tomorrow between 8-6

## 2016-02-08 NOTE — Telephone Encounter (Signed)
Paperwork scanned and faxed to company on 02/08/16

## 2016-02-28 ENCOUNTER — Other Ambulatory Visit: Payer: Self-pay | Admitting: Neurosurgery

## 2016-02-28 ENCOUNTER — Telehealth: Payer: Self-pay

## 2016-02-28 NOTE — Telephone Encounter (Signed)
Patient needs FMLA forms updated for the [employer], I have completed the section that I knew but I wasn't sure about the last page if you could look over this and then return it to the FMLA/Disability box at the 102 checkou tdesk within 5-7 business days. Thank you!

## 2016-03-02 NOTE — Telephone Encounter (Signed)
[facility]

## 2016-03-03 NOTE — Telephone Encounter (Signed)
Paperwork scanned and faxed to [employer] on 03/03/16

## 2016-03-07 ENCOUNTER — Inpatient Hospital Stay (HOSPITAL_COMMUNITY): Admission: RE | Admit: 2016-03-07 | Payer: BLUE CROSS/BLUE SHIELD | Source: Ambulatory Visit

## 2016-03-07 ENCOUNTER — Encounter (HOSPITAL_COMMUNITY)
Admission: RE | Admit: 2016-03-07 | Discharge: 2016-03-07 | Disposition: A | Discharge status: Home / Self Care | Payer: BLUE CROSS/BLUE SHIELD | Source: Ambulatory Visit | Attending: Neurosurgery | Admitting: Neurosurgery

## 2016-03-07 ENCOUNTER — Encounter (HOSPITAL_COMMUNITY): Payer: Self-pay

## 2016-03-07 DIAGNOSIS — G935 Compression of brain: Secondary | ICD-10-CM | POA: Insufficient documentation

## 2016-03-07 DIAGNOSIS — Z01812 Encounter for preprocedural laboratory examination: Secondary | ICD-10-CM | POA: Insufficient documentation

## 2016-03-07 HISTORY — DX: Tinnitus, unspecified ear: H93.19

## 2016-03-07 HISTORY — DX: Depression, unspecified: F32.A

## 2016-03-07 HISTORY — DX: Anesthesia of skin: R20.0

## 2016-03-07 HISTORY — DX: Major depressive disorder, single episode, unspecified: F32.9

## 2016-03-07 HISTORY — DX: Paresthesia of skin: R20.2

## 2016-03-07 HISTORY — DX: Anxiety disorder, unspecified: F41.9

## 2016-03-07 HISTORY — DX: Unspecified urinary incontinence: R32

## 2016-03-07 LAB — COMPREHENSIVE METABOLIC PANEL
ALBUMIN: 4 g/dL (ref 3.5–5.0)
ALT: 21 U/L (ref 14–54)
AST: 24 U/L (ref 15–41)
Alkaline Phosphatase: 75 U/L (ref 38–126)
Anion gap: 8 (ref 5–15)
BUN: 9 mg/dL (ref 6–20)
CHLORIDE: 107 mmol/L (ref 101–111)
CO2: 21 mmol/L — AB (ref 22–32)
Calcium: 9 mg/dL (ref 8.9–10.3)
Creatinine, Ser: 0.77 mg/dL (ref 0.44–1.00)
GFR calc Af Amer: >60 mL/min (ref >60)
GFR calc non Af Amer: >60 mL/min (ref >60)
GLUCOSE: 94 mg/dL (ref 65–99)
POTASSIUM: 3.5 mmol/L (ref 3.5–5.1)
Sodium: 136 mmol/L (ref 135–145)
Total Bilirubin: 0.2 mg/dL — ABNORMAL LOW (ref 0.3–1.2)
Total Protein: 6.6 g/dL (ref 6.5–8.1)

## 2016-03-07 LAB — CBC
HCT: 40.5 % (ref 36.0–46.0)
HEMOGLOBIN: 13.5 g/dL (ref 12.0–15.0)
MCH: 29.5 pg (ref 26.0–34.0)
MCHC: 33.3 g/dL (ref 30.0–36.0)
MCV: 88.4 fL (ref 78.0–100.0)
PLATELETS: 244 10*3/uL (ref 150–400)
RBC: 4.58 MIL/uL (ref 3.87–5.11)
RDW: 12.7 % (ref 11.5–15.5)
WBC: 10.5 10*3/uL (ref 4.0–10.5)

## 2016-03-07 LAB — SURGICAL PCR SCREEN
MRSA, PCR: NEGATIVE
Staphylococcus aureus: NEGATIVE

## 2016-03-07 LAB — HCG, SERUM, QUALITATIVE: PREG SERUM: NEGATIVE

## 2016-03-07 NOTE — Progress Notes (Signed)
PCP - Dr. Wallis BambergMario Key Cardiologist - denies  EKG - denies CXR - 07/19/15  Echo/stress test/cardiac cath - denies  Patient denies chest pain and shortness of breath at PAT appointment.

## 2016-03-07 NOTE — Pre-Procedure Instructions (Signed)
Rhonda FilesShannon R Key  03/07/2016      [id number] [place]- [place], [place] [phone] [address] AT [facility] [address] [place] [address] Phone: [phone] Fax: [phone]  [id number] [place]- [place], [place] - [address] AT [facility] [address] [place] [address] Phone: [phone] Fax: [phone]  [id number] - [place] - [address] [place] [address] Phone: [phone] Fax: [phone]  [id number] [place]- [place], [place] - [address] AT [facility] [address] [facility] [address] [place] [address] Phone: [phone] Fax: [phone]    Your procedure is scheduled on Friday, June 23rd, 2017.  [facility]:30 A.M.   Call this number if you have problems the morning of surgery:  [phone]   Remember:  Do not eat food or drink liquids after midnight.   Take these medicines the morning of surgery with A SIP OF WATER: Meclizine (Antivert) if needed.  Stop taking: Aspirin-acetaminophen-caffeine (Excedrin Migraine), Aspirin, NSAIDS, Aleve, Naproxen, Ibuprofen, Advil, Motrin, BC's, Goody's, Fish oil, all herbal medications, and all vitamins.    Do not wear jewelry, make-up or nail polish.  Do not wear lotions, powders, or perfumes.    Do not shave 48 hours prior to surgery.    Do not bring valuables to the hospital.  [facility] is not responsible for any belongings or valuables.  Contacts, dentures or bridgework may not be worn into surgery.  Leave your suitcase in the car.  After surgery it may be brought to your room.  For patients admitted to the hospital, discharge time will be determined by your treatment team.  Patients discharged the day of surgery will not be allowed to drive home.   Special instructions:  See attached.   Please read over the  following fact sheets that you were given. MRSA Information    [facility]- Preparing For Surgery  Before surgery, you can play an important role. Because skin is not sterile, your skin needs to be as free of germs as possible. You can reduce the number of germs on your skin by washing with CHG (chlorahexidine gluconate) Soap before surgery.  CHG is an antiseptic cleaner which kills germs and bonds with the skin to continue killing germs even after washing.  Please do not use if you have an allergy to CHG or antibacterial soaps. If your skin becomes reddened/irritated stop using the CHG.  Do not shave (including legs and underarms) for at least 48 hours prior to first CHG shower. It is OK to shave your face.  Please follow these instructions carefully.   1. Shower the NIGHT BEFORE SURGERY and the MORNING OF SURGERY with CHG.   2. If you chose to wash your hair, wash your hair first as usual with your normal shampoo.  3. After you shampoo, rinse your hair and body thoroughly to remove the shampoo.  4. Use CHG as you would any other liquid soap. You can apply CHG directly to the skin and wash gently with a scrungie or a clean washcloth.   5. Apply the CHG Soap to your body ONLY FROM THE NECK DOWN.  Do not use on open wounds or open sores. Avoid contact with your eyes, ears, mouth and genitals (private parts). Wash genitals (private parts) with your normal soap.  6. Wash thoroughly, paying  special attention to the area where your surgery will be performed.  7. Thoroughly rinse your body with warm water from the neck down.  8. DO NOT shower/wash with your normal soap after using and rinsing off the CHG Soap.  9. Pat yourself dry with a CLEAN TOWEL.   10. Wear CLEAN PAJAMAS   11. Place CLEAN SHEETS on your bed the night of your first shower and DO NOT SLEEP WITH PETS.  Day of Surgery: Do not apply any deodorants/lotions. Please wear clean clothes to the hospital/surgery center.

## 2016-03-09 ENCOUNTER — Encounter (HOSPITAL_COMMUNITY): Payer: Self-pay | Admitting: Anesthesiology

## 2016-03-09 MED ORDER — CEFAZOLIN SODIUM-DEXTROSE 2-4 GM/100ML-% IV SOLN
2.0000 g | INTRAVENOUS | Status: AC
  Administered 2016-03-10: 2 g via INTRAVENOUS
  Filled 2016-03-09: qty 100

## 2016-03-09 NOTE — Anesthesia Preprocedure Evaluation (Addendum)
Anesthesia Evaluation  Patient identified by MRN, date of birth, ID band Patient awake    Reviewed: Allergy & Precautions, NPO status , Patient's Chart, lab work & pertinent test results  Airway Mallampati: II  TM Distance: >3 FB Neck ROM: Full   Comment: MAC 3 times one in 2014 Dental no notable dental hx.    Pulmonary former smoker,    Pulmonary exam normal breath sounds clear to auscultation       Cardiovascular negative cardio ROS Normal cardiovascular exam Rhythm:Regular Rate:Normal     Neuro/Psych  Headaches, PSYCHIATRIC DISORDERS Anxiety Depression Numbness and tingling right > leg leg and hands    GI/Hepatic negative GI ROS, Neg liver ROS,   Endo/Other  negative endocrine ROS  Renal/GU negative Renal ROS  negative genitourinary   Musculoskeletal negative musculoskeletal ROS (+)   Abdominal   Peds negative pediatric ROS (+)  Hematology negative hematology ROS (+)   Anesthesia Other Findings   Reproductive/Obstetrics Negative pregnancy test 03-07-16                           Anesthesia Physical Anesthesia Plan  ASA: II  Anesthesia Plan: General   Post-op Pain Management:    Induction: Intravenous  Airway Management Planned: Oral ETT  Additional Equipment:   Intra-op Plan:   Post-operative Plan: Extubation in OR  Informed Consent: I have reviewed the patients History and Physical, chart, labs and discussed the procedure including the risks, benefits and alternatives for the proposed anesthesia with the patient or authorized representative who has indicated his/her understanding and acceptance.   Dental advisory given  Plan Discussed with: CRNA  Anesthesia Plan Comments:         Anesthesia Quick Evaluation

## 2016-03-10 ENCOUNTER — Inpatient Hospital Stay (HOSPITAL_COMMUNITY): Payer: BLUE CROSS/BLUE SHIELD | Admitting: Anesthesiology

## 2016-03-10 ENCOUNTER — Encounter (HOSPITAL_COMMUNITY)
Admission: RE | Disposition: A | Discharge status: Home / Self Care | Payer: Self-pay | Source: Ambulatory Visit | Attending: Neurosurgery

## 2016-03-10 ENCOUNTER — Inpatient Hospital Stay (HOSPITAL_COMMUNITY)
Admission: RE | Admit: 2016-03-10 | Discharge: 2016-03-13 | DRG: 030 | Disposition: A | Discharge status: Home / Self Care | Payer: BLUE CROSS/BLUE SHIELD | Source: Ambulatory Visit | Attending: Neurosurgery | Admitting: Neurosurgery

## 2016-03-10 ENCOUNTER — Encounter (HOSPITAL_COMMUNITY): Payer: Self-pay | Admitting: General Practice

## 2016-03-10 DIAGNOSIS — Z23 Encounter for immunization: Secondary | ICD-10-CM

## 2016-03-10 DIAGNOSIS — G935 Compression of brain: Secondary | ICD-10-CM | POA: Diagnosis present

## 2016-03-10 DIAGNOSIS — Z01812 Encounter for preprocedural laboratory examination: Secondary | ICD-10-CM | POA: Diagnosis not present

## 2016-03-10 DIAGNOSIS — F17200 Nicotine dependence, unspecified, uncomplicated: Secondary | ICD-10-CM | POA: Diagnosis present

## 2016-03-10 HISTORY — PX: SUBOCCIPITAL CRANIECTOMY CERVICAL LAMINECTOMY: SHX5404

## 2016-03-10 SURGERY — SUBOCCIPITAL CRANIECTOMY CERVICAL LAMINECTOMY/DURAPLASTY
Anesthesia: General

## 2016-03-10 MED ORDER — DIAZEPAM 5 MG PO TABS
5.0000 mg | ORAL_TABLET | Freq: Four times a day (QID) | ORAL | Status: DC | PRN
  Administered 2016-03-10: 5 mg via ORAL
  Administered 2016-03-11 (×2): 10 mg via ORAL
  Filled 2016-03-10: qty 1
  Filled 2016-03-10: qty 2
  Filled 2016-03-10: qty 1
  Filled 2016-03-10: qty 2

## 2016-03-10 MED ORDER — HYDROMORPHONE HCL 1 MG/ML IJ SOLN
INTRAMUSCULAR | Status: AC
  Administered 2016-03-10: 0.5 mg via INTRAVENOUS
  Filled 2016-03-10: qty 1

## 2016-03-10 MED ORDER — LIDOCAINE 2% (20 MG/ML) 5 ML SYRINGE
INTRAMUSCULAR | Status: AC
  Filled 2016-03-10: qty 5

## 2016-03-10 MED ORDER — FENTANYL CITRATE (PF) 250 MCG/5ML IJ SOLN
INTRAMUSCULAR | Status: AC
  Filled 2016-03-10: qty 5

## 2016-03-10 MED ORDER — NEOSTIGMINE METHYLSULFATE 5 MG/5ML IV SOSY
PREFILLED_SYRINGE | INTRAVENOUS | Status: AC
  Filled 2016-03-10: qty 5

## 2016-03-10 MED ORDER — VANCOMYCIN HCL 1000 MG IV SOLR
INTRAVENOUS | Status: AC
  Filled 2016-03-10: qty 1000

## 2016-03-10 MED ORDER — PROMETHAZINE HCL 25 MG PO TABS
12.5000 mg | ORAL_TABLET | ORAL | Status: DC | PRN
  Administered 2016-03-10: 25 mg via ORAL
  Filled 2016-03-10: qty 1

## 2016-03-10 MED ORDER — HYDROCODONE-ACETAMINOPHEN 5-325 MG PO TABS
1.0000 | ORAL_TABLET | ORAL | Status: DC | PRN
  Administered 2016-03-10 – 2016-03-11 (×3): 1 via ORAL
  Filled 2016-03-10 (×4): qty 1

## 2016-03-10 MED ORDER — THROMBIN 5000 UNITS EX SOLR
CUTANEOUS | Status: DC | PRN
  Administered 2016-03-10 (×2): 5000 [IU] via TOPICAL

## 2016-03-10 MED ORDER — PNEUMOCOCCAL VAC POLYVALENT 25 MCG/0.5ML IJ INJ
0.5000 mL | INJECTION | INTRAMUSCULAR | Status: AC
  Administered 2016-03-11: 0.5 mL via INTRAMUSCULAR
  Filled 2016-03-10: qty 0.5

## 2016-03-10 MED ORDER — ACETAMINOPHEN 10 MG/ML IV SOLN
INTRAVENOUS | Status: DC | PRN
  Administered 2016-03-10: 1000 mg via INTRAVENOUS

## 2016-03-10 MED ORDER — DOCUSATE SODIUM 100 MG PO CAPS
100.0000 mg | ORAL_CAPSULE | Freq: Two times a day (BID) | ORAL | Status: DC
  Administered 2016-03-10 – 2016-03-13 (×6): 100 mg via ORAL
  Filled 2016-03-10 (×6): qty 1

## 2016-03-10 MED ORDER — MIDAZOLAM HCL 2 MG/2ML IJ SOLN
INTRAMUSCULAR | Status: AC
  Filled 2016-03-10: qty 2

## 2016-03-10 MED ORDER — ACETAMINOPHEN 325 MG PO TABS
650.0000 mg | ORAL_TABLET | ORAL | Status: DC | PRN
  Administered 2016-03-12 – 2016-03-13 (×5): 650 mg via ORAL
  Filled 2016-03-10 (×5): qty 2

## 2016-03-10 MED ORDER — BACITRACIN ZINC 500 UNIT/GM EX OINT
TOPICAL_OINTMENT | CUTANEOUS | Status: DC | PRN
  Administered 2016-03-10: 1 via TOPICAL

## 2016-03-10 MED ORDER — FENTANYL CITRATE (PF) 100 MCG/2ML IJ SOLN
INTRAMUSCULAR | Status: AC
  Filled 2016-03-10: qty 2

## 2016-03-10 MED ORDER — LORAZEPAM 0.5 MG PO TABS: 0.5000 mg | ORAL_TABLET | Freq: Two times a day (BID) | ORAL | Status: DC | PRN

## 2016-03-10 MED ORDER — ONDANSETRON HCL 4 MG/2ML IJ SOLN
4.0000 mg | INTRAMUSCULAR | Status: DC | PRN
  Administered 2016-03-10 – 2016-03-11 (×2): 4 mg via INTRAVENOUS
  Filled 2016-03-10: qty 2

## 2016-03-10 MED ORDER — SODIUM CHLORIDE 0.9 % IV SOLN
INTRAVENOUS | Status: DC | PRN
  Administered 2016-03-10: 08:00:00 via INTRAVENOUS

## 2016-03-10 MED ORDER — PHENYLEPHRINE HCL 10 MG/ML IJ SOLN
INTRAMUSCULAR | Status: DC | PRN
  Administered 2016-03-10: 120 ug via INTRAVENOUS

## 2016-03-10 MED ORDER — CEFAZOLIN SODIUM-DEXTROSE 2-4 GM/100ML-% IV SOLN
2.0000 g | Freq: Three times a day (TID) | INTRAVENOUS | Status: AC
  Administered 2016-03-10 – 2016-03-11 (×2): 2 g via INTRAVENOUS
  Filled 2016-03-10 (×2): qty 100

## 2016-03-10 MED ORDER — PROPOFOL 10 MG/ML IV BOLUS
INTRAVENOUS | Status: AC
  Filled 2016-03-10: qty 40

## 2016-03-10 MED ORDER — GLYCOPYRROLATE 0.2 MG/ML IJ SOLN
INTRAMUSCULAR | Status: DC | PRN
  Administered 2016-03-10: .6 mg via INTRAVENOUS

## 2016-03-10 MED ORDER — BUPIVACAINE LIPOSOME 1.3 % IJ SUSP
20.0000 mL | Freq: Once | INTRAMUSCULAR | Status: DC
  Filled 2016-03-10: qty 20

## 2016-03-10 MED ORDER — ONDANSETRON HCL 4 MG/2ML IJ SOLN
INTRAMUSCULAR | Status: DC | PRN
  Administered 2016-03-10: 4 mg via INTRAVENOUS

## 2016-03-10 MED ORDER — DEXAMETHASONE 4 MG PO TABS
4.0000 mg | ORAL_TABLET | Freq: Three times a day (TID) | ORAL | Status: DC
  Administered 2016-03-12 – 2016-03-13 (×2): 4 mg via ORAL
  Filled 2016-03-10 (×2): qty 1

## 2016-03-10 MED ORDER — POTASSIUM CHLORIDE IN NACL 20-0.9 MEQ/L-% IV SOLN
INTRAVENOUS | Status: DC
  Administered 2016-03-10 – 2016-03-13 (×3): via INTRAVENOUS
  Filled 2016-03-10 (×6): qty 1000

## 2016-03-10 MED ORDER — ASPIRIN-ACETAMINOPHEN-CAFFEINE 250-250-65 MG PO TABS
1.0000 | ORAL_TABLET | Freq: Four times a day (QID) | ORAL | Status: DC | PRN
  Administered 2016-03-11 – 2016-03-13 (×4): 1 via ORAL
  Filled 2016-03-10 (×7): qty 1

## 2016-03-10 MED ORDER — ROCURONIUM BROMIDE 100 MG/10ML IV SOLN
INTRAVENOUS | Status: DC | PRN
  Administered 2016-03-10: 5 mg via INTRAVENOUS
  Administered 2016-03-10 (×2): 10 mg via INTRAVENOUS
  Administered 2016-03-10: 50 mg via INTRAVENOUS
  Administered 2016-03-10: 20 mg via INTRAVENOUS

## 2016-03-10 MED ORDER — DEXAMETHASONE 4 MG PO TABS
4.0000 mg | ORAL_TABLET | Freq: Four times a day (QID) | ORAL | Status: AC
  Administered 2016-03-11 – 2016-03-12 (×4): 4 mg via ORAL
  Filled 2016-03-10 (×4): qty 1

## 2016-03-10 MED ORDER — ACETAMINOPHEN 10 MG/ML IV SOLN
INTRAVENOUS | Status: AC
  Filled 2016-03-10: qty 100

## 2016-03-10 MED ORDER — MIDAZOLAM HCL 5 MG/5ML IJ SOLN
INTRAMUSCULAR | Status: DC | PRN
  Administered 2016-03-10 (×2): 1 mg via INTRAVENOUS

## 2016-03-10 MED ORDER — ONDANSETRON HCL 4 MG PO TABS: 4.0000 mg | ORAL_TABLET | ORAL | Status: DC | PRN

## 2016-03-10 MED ORDER — BUPIVACAINE LIPOSOME 1.3 % IJ SUSP
INTRAMUSCULAR | Status: DC | PRN
  Administered 2016-03-10: 20 mL

## 2016-03-10 MED ORDER — FAMOTIDINE IN NACL 20-0.9 MG/50ML-% IV SOLN
20.0000 mg | Freq: Two times a day (BID) | INTRAVENOUS | Status: DC
  Administered 2016-03-10 – 2016-03-11 (×2): 20 mg via INTRAVENOUS
  Filled 2016-03-10 (×2): qty 50

## 2016-03-10 MED ORDER — LIDOCAINE HCL (CARDIAC) 20 MG/ML IV SOLN
INTRAVENOUS | Status: DC | PRN
  Administered 2016-03-10: 90 mg via INTRAVENOUS

## 2016-03-10 MED ORDER — HYDROMORPHONE HCL 1 MG/ML IJ SOLN
INTRAMUSCULAR | Status: AC
  Filled 2016-03-10: qty 1

## 2016-03-10 MED ORDER — LABETALOL HCL 5 MG/ML IV SOLN: 10.0000 mg | INTRAVENOUS | Status: DC | PRN

## 2016-03-10 MED ORDER — ETONOGESTREL 68 MG ~~LOC~~ IMPL
1.0000 | DRUG_IMPLANT | Freq: Once | SUBCUTANEOUS | Status: DC
Start: 2016-03-10 — End: 2016-03-10

## 2016-03-10 MED ORDER — DIAZEPAM 5 MG PO TABS
ORAL_TABLET | ORAL | Status: AC
  Filled 2016-03-10: qty 1

## 2016-03-10 MED ORDER — DEXAMETHASONE 6 MG PO TABS
6.0000 mg | ORAL_TABLET | Freq: Four times a day (QID) | ORAL | Status: AC
  Administered 2016-03-10 – 2016-03-11 (×4): 6 mg via ORAL
  Filled 2016-03-10 (×4): qty 1

## 2016-03-10 MED ORDER — DIAZEPAM 5 MG PO TABS
5.0000 mg | ORAL_TABLET | Freq: Four times a day (QID) | ORAL | Status: DC | PRN
  Administered 2016-03-10: 5 mg via ORAL

## 2016-03-10 MED ORDER — MECLIZINE HCL 12.5 MG PO TABS
25.0000 mg | ORAL_TABLET | Freq: Three times a day (TID) | ORAL | Status: DC | PRN
  Filled 2016-03-10: qty 1
  Filled 2016-03-10: qty 2

## 2016-03-10 MED ORDER — 0.9 % SODIUM CHLORIDE (POUR BTL) OPTIME
TOPICAL | Status: DC | PRN
  Administered 2016-03-10: 1000 mL

## 2016-03-10 MED ORDER — HEMOSTATIC AGENTS (NO CHARGE) OPTIME
TOPICAL | Status: DC | PRN
  Administered 2016-03-10: 1 via TOPICAL

## 2016-03-10 MED ORDER — HYDROMORPHONE HCL 1 MG/ML IJ SOLN
0.2500 mg | INTRAMUSCULAR | Status: DC | PRN
  Administered 2016-03-10 (×4): 0.5 mg via INTRAVENOUS

## 2016-03-10 MED ORDER — DEXAMETHASONE SODIUM PHOSPHATE 10 MG/ML IJ SOLN
INTRAMUSCULAR | Status: DC | PRN
  Administered 2016-03-10: 10 mg via INTRAVENOUS

## 2016-03-10 MED ORDER — LIDOCAINE-EPINEPHRINE 1 %-1:100000 IJ SOLN
INTRAMUSCULAR | Status: DC | PRN
  Administered 2016-03-10: 12 mL

## 2016-03-10 MED ORDER — BUPIVACAINE HCL (PF) 0.25 % IJ SOLN
INTRAMUSCULAR | Status: DC | PRN
  Administered 2016-03-10: 12 mL

## 2016-03-10 MED ORDER — PROPOFOL 10 MG/ML IV BOLUS
INTRAVENOUS | Status: DC | PRN
  Administered 2016-03-10: 50 mg via INTRAVENOUS
  Administered 2016-03-10: 150 mg via INTRAVENOUS
  Administered 2016-03-10: 50 mg via INTRAVENOUS

## 2016-03-10 MED ORDER — BISACODYL 10 MG RE SUPP
10.0000 mg | Freq: Every day | RECTAL | Status: DC | PRN
  Administered 2016-03-13: 10 mg via RECTAL
  Filled 2016-03-10: qty 1

## 2016-03-10 MED ORDER — GLYCOPYRROLATE 0.2 MG/ML IV SOSY
PREFILLED_SYRINGE | INTRAVENOUS | Status: AC
  Filled 2016-03-10: qty 3

## 2016-03-10 MED ORDER — MORPHINE SULFATE (PF) 2 MG/ML IV SOLN
1.0000 mg | INTRAVENOUS | Status: DC | PRN
  Administered 2016-03-10 – 2016-03-11 (×4): 2 mg via INTRAVENOUS
  Filled 2016-03-10 (×4): qty 1

## 2016-03-10 MED ORDER — ACETAMINOPHEN 650 MG RE SUPP: 650.0000 mg | RECTAL | Status: DC | PRN

## 2016-03-10 MED ORDER — ROCURONIUM BROMIDE 50 MG/5ML IV SOLN
INTRAVENOUS | Status: AC
  Filled 2016-03-10: qty 1

## 2016-03-10 MED ORDER — TIZANIDINE HCL 4 MG PO TABS
2.0000 mg | ORAL_TABLET | Freq: Four times a day (QID) | ORAL | Status: DC | PRN
  Administered 2016-03-11: 2 mg via ORAL
  Administered 2016-03-12 (×2): 4 mg via ORAL
  Administered 2016-03-12: 2 mg via ORAL
  Administered 2016-03-13: 4 mg via ORAL
  Filled 2016-03-10 (×6): qty 1

## 2016-03-10 MED ORDER — PROMETHAZINE HCL 25 MG/ML IJ SOLN: 6.2500 mg | INTRAMUSCULAR | Status: DC | PRN

## 2016-03-10 MED ORDER — NEOSTIGMINE METHYLSULFATE 10 MG/10ML IV SOLN
INTRAVENOUS | Status: DC | PRN
  Administered 2016-03-10: 4 mg via INTRAVENOUS

## 2016-03-10 MED ORDER — FENTANYL CITRATE (PF) 100 MCG/2ML IJ SOLN
INTRAMUSCULAR | Status: DC | PRN
  Administered 2016-03-10 (×8): 50 ug via INTRAVENOUS
  Administered 2016-03-10: 150 ug via INTRAVENOUS
  Administered 2016-03-10: 50 ug via INTRAVENOUS
  Administered 2016-03-10: 150 ug via INTRAVENOUS

## 2016-03-10 MED ORDER — POLYETHYLENE GLYCOL 3350 17 G PO PACK
17.0000 g | PACK | Freq: Every day | ORAL | Status: DC | PRN
  Administered 2016-03-13: 17 g via ORAL
  Filled 2016-03-10: qty 1

## 2016-03-10 MED ORDER — TIZANIDINE HCL 4 MG PO TABS: 2.0000 mg | ORAL_TABLET | Freq: Four times a day (QID) | ORAL | Status: DC | PRN

## 2016-03-10 MED ORDER — PHENYLEPHRINE HCL 10 MG/ML IJ SOLN
10.0000 mg | INTRAVENOUS | Status: DC | PRN
  Administered 2016-03-10: 10 ug/min via INTRAVENOUS

## 2016-03-10 MED ORDER — AMITRIPTYLINE HCL 25 MG PO TABS: 25.0000 mg | ORAL_TABLET | Freq: Every day | ORAL | Status: DC

## 2016-03-10 MED ORDER — FENTANYL CITRATE (PF) 100 MCG/2ML IJ SOLN
25.0000 ug | INTRAMUSCULAR | Status: AC | PRN
  Administered 2016-03-10 (×6): 25 ug via INTRAVENOUS

## 2016-03-10 SURGICAL SUPPLY — 82 items
APL SKNCLS STERI-STRIP NONHPOA (GAUZE/BANDAGES/DRESSINGS)
APL SRG 60D 8 XTD TIP BNDBL (TIP) ×1
BENZOIN TINCTURE PRP APPL 2/3 (GAUZE/BANDAGES/DRESSINGS) IMPLANT
BLADE CLIPPER SURG (BLADE) ×6 IMPLANT
BLADE SURG 15 STRL LF DISP TIS (BLADE) IMPLANT
BLADE SURG 15 STRL SS (BLADE) ×3
BLADE ULTRA TIP 2M (BLADE) IMPLANT
BRUSH SCRUB EZ 1% IODOPHOR (MISCELLANEOUS) ×3 IMPLANT
BUR MATCHSTICK NEURO 3.0 LAGG (BURR) ×2 IMPLANT
BUR RND OSTEON ELITE 6.0 (BURR) ×2 IMPLANT
BUR RND OSTEON ELITE 6.0MM (BURR) ×1
CANISTER SUCT 3000ML PPV (MISCELLANEOUS) ×3 IMPLANT
CLIP TI MEDIUM 6 (CLIP) ×4 IMPLANT
CLOSURE WOUND 1/2 X4 (GAUZE/BANDAGES/DRESSINGS)
CORDS BIPOLAR (ELECTRODE) ×3 IMPLANT
COVER BACK TABLE 60X90IN (DRAPES) IMPLANT
DECANTER SPIKE VIAL GLASS SM (MISCELLANEOUS) ×3 IMPLANT
DRAIN SNY WOU 7FLT (WOUND CARE) IMPLANT
DRAPE LAPAROTOMY 100X72 PEDS (DRAPES) ×3 IMPLANT
DRAPE MICROSCOPE LEICA (MISCELLANEOUS) ×2 IMPLANT
DRAPE POUCH INSTRU U-SHP 10X18 (DRAPES) ×3 IMPLANT
DRAPE WARM FLUID 44X44 (DRAPE) ×5 IMPLANT
DRSG OPSITE POSTOP 4X8 (GAUZE/BANDAGES/DRESSINGS) ×2 IMPLANT
DURAMATRIX ONLAY 3X3 (Plate) ×2 IMPLANT
DURAPREP 6ML APPLICATOR 50/CS (WOUND CARE) ×3 IMPLANT
DURASEAL APPLICATOR TIP (TIP) ×2 IMPLANT
DURASEAL SPINE SEALANT 3ML (MISCELLANEOUS) ×2 IMPLANT
ELECT CAUTERY BLADE 6.4 (BLADE) ×3 IMPLANT
ELECT REM PT RETURN 9FT ADLT (ELECTROSURGICAL) ×3
ELECTRODE REM PT RTRN 9FT ADLT (ELECTROSURGICAL) ×1 IMPLANT
EVACUATOR 1/8 PVC DRAIN (DRAIN) IMPLANT
EVACUATOR SILICONE 100CC (DRAIN) IMPLANT
FORCEPS BIPOLAR SPETZLER 8 1.0 (NEUROSURGERY SUPPLIES) ×2 IMPLANT
GAUZE SPONGE 4X4 12PLY STRL (GAUZE/BANDAGES/DRESSINGS) IMPLANT
GAUZE SPONGE 4X4 16PLY XRAY LF (GAUZE/BANDAGES/DRESSINGS) IMPLANT
GLOVE BIO SURGEON STRL SZ8 (GLOVE) ×3 IMPLANT
GLOVE BIOGEL PI IND STRL 8 (GLOVE) ×1 IMPLANT
GLOVE BIOGEL PI IND STRL 8.5 (GLOVE) ×1 IMPLANT
GLOVE BIOGEL PI INDICATOR 8 (GLOVE) ×2
GLOVE BIOGEL PI INDICATOR 8.5 (GLOVE) ×2
GLOVE ECLIPSE 7.5 STRL STRAW (GLOVE) ×1 IMPLANT
GLOVE ECLIPSE 8.0 STRL XLNG CF (GLOVE) ×2 IMPLANT
GLOVE ECLIPSE 9.0 STRL (GLOVE) ×2 IMPLANT
GLOVE EXAM NITRILE LRG STRL (GLOVE) IMPLANT
GLOVE EXAM NITRILE MD LF STRL (GLOVE) IMPLANT
GLOVE EXAM NITRILE XL STR (GLOVE) IMPLANT
GLOVE EXAM NITRILE XS STR PU (GLOVE) IMPLANT
GLOVE INDICATOR 7.0 STRL GRN (GLOVE) ×6 IMPLANT
GLOVE INDICATOR 7.5 STRL GRN (GLOVE) ×2 IMPLANT
GOWN STRL REUS W/ TWL LRG LVL3 (GOWN DISPOSABLE) IMPLANT
GOWN STRL REUS W/ TWL XL LVL3 (GOWN DISPOSABLE) IMPLANT
GOWN STRL REUS W/TWL 2XL LVL3 (GOWN DISPOSABLE) ×2 IMPLANT
GOWN STRL REUS W/TWL LRG LVL3 (GOWN DISPOSABLE) ×3
GOWN STRL REUS W/TWL XL LVL3 (GOWN DISPOSABLE) ×6
HEMOSTAT SURGICEL 2X14 (HEMOSTASIS) IMPLANT
KIT BASIN OR (CUSTOM PROCEDURE TRAY) ×3 IMPLANT
KIT ROOM TURNOVER OR (KITS) ×3 IMPLANT
NDL HYPO 21X1.5 SAFETY (NEEDLE) IMPLANT
NDL HYPO 25X1 1.5 SAFETY (NEEDLE) ×1 IMPLANT
NEEDLE HYPO 21X1.5 SAFETY (NEEDLE) ×3 IMPLANT
NEEDLE HYPO 25X1 1.5 SAFETY (NEEDLE) ×3 IMPLANT
NS IRRIG 1000ML POUR BTL (IV SOLUTION) ×3 IMPLANT
PACK CRANIOTOMY (CUSTOM PROCEDURE TRAY) ×3 IMPLANT
PAD ARMBOARD 7.5X6 YLW CONV (MISCELLANEOUS) ×9 IMPLANT
PATTIES SURGICAL 1/4 X 3 (GAUZE/BANDAGES/DRESSINGS) IMPLANT
RUBBERBAND STERILE (MISCELLANEOUS) ×4 IMPLANT
SPONGE LAP 4X18 X RAY DECT (DISPOSABLE) IMPLANT
SPONGE SURGIFOAM ABS GEL SZ50 (HEMOSTASIS) ×3 IMPLANT
STAPLER SKIN PROX WIDE 3.9 (STAPLE) ×3 IMPLANT
STRIP CLOSURE SKIN 1/2X4 (GAUZE/BANDAGES/DRESSINGS) IMPLANT
SUT ETHILON 3 0 FSL (SUTURE) ×2 IMPLANT
SUT PROLENE 6 0 BV (SUTURE) ×6 IMPLANT
SUT VIC AB 0 CT1 18XCR BRD8 (SUTURE) ×1 IMPLANT
SUT VIC AB 0 CT1 8-18 (SUTURE) ×3
SUT VIC AB 2-0 CP2 18 (SUTURE) ×3 IMPLANT
SUT VIC AB 3-0 SH 8-18 (SUTURE) IMPLANT
SYR 20CC LL (SYRINGE) ×2 IMPLANT
TOWEL OR 17X24 6PK STRL BLUE (TOWEL DISPOSABLE) IMPLANT
TOWEL OR 17X26 10 PK STRL BLUE (TOWEL DISPOSABLE) ×3 IMPLANT
TRAY FOLEY W/METER SILVER 16FR (SET/KITS/TRAYS/PACK) ×2 IMPLANT
UNDERPAD 30X30 INCONTINENT (UNDERPADS AND DIAPERS) IMPLANT
WATER STERILE IRR 1000ML POUR (IV SOLUTION) ×3 IMPLANT

## 2016-03-10 NOTE — Transfer of Care (Signed)
Immediate Anesthesia Transfer of Care Note  Patient: Terrilee FilesShannon R Keo  Procedure(s) Performed: Procedure(s): Chiari Decompression with cervical laminectomy (N/A)  Patient Location: PACU  Anesthesia Type:General  Level of Consciousness: awake, alert , oriented and patient cooperative  Airway & Oxygen Therapy: Patient Spontanous Breathing and Patient connected to nasal cannula oxygen  Post-op Assessment: Report given to RN and Post -op Vital signs reviewed and stable  Post vital signs: Reviewed and stable  Last Vitals:  Filed Vitals:   03/10/16 0620  BP: 119/57  Pulse: 59  Temp: 36.8 C  Resp: 18    Last Pain: There were no vitals filed for this visit.    Patients Stated Pain Goal: 2 (03/10/16 0630)  Complications: No apparent anesthesia complications

## 2016-03-10 NOTE — H&P (Signed)
Patient ID:   [id number] Patient: Rhonda Key  Date of Birth: 1986/06/11 Visit Type: Office Visit   Date: 02/23/2016 03:00 PM Provider: Danae OrleansJoseph D. Venetia MaxonStern MD   This 30 year old female presents for Headache.  History of Present Illness: 1.  Headache    Rhonda Key, 30 year old female employed with [employer], returns, having last seen Dr. Yetta BarreJones in 2015 following redo microdiscectomy.  She visits today for evaluation of headaches, facial numbness, balance changes, and ear pain.  She reports onset of headaches since 2011 with neck pain and facial/neck/hand numbness beginning in 2017.  She reports symptoms have been severe the last three weeks, prompting ER visit in [place].  Head CT is not available today.  MRI brain and cervical spine on [place]. She is having numbness and tingling in her arms and hands.   Physical Exam: UE and LE strength is normal. Reflexes are normal. She has a nystagmus in both eyes.  She states she is not taking pain medication as she must take care of her children.   Surgical history: February 2014 right L5-S1 microdiscectomy, May 2014 redo right L5-S1 microdiscectomy, both by Dr. Yetta BarreJones  01/11/16 MRI: Cerebellar tonsils are low-lying and impacted consistent with Chiari malformatonwith cerebellar tonsillar ectopia of 21 mm. No evidence of intacranial hypotension or hydrocephalus.        PAST MEDICAL/SURGICAL HISTORY   (Detailed)  Disease/disorder Onset Date Management Date Comments    laminectomy  AGW 02/23/2016 -     PAST MEDICAL HISTORY, SURGICAL HISTORY, FAMILY HISTORY, SOCIAL HISTORY AND REVIEW OF SYSTEMS  02/23/2016, which I have signed.  Family History  (Detailed) Relationship Family Member Name Deceased Age at Death Condition Onset Age Cause of Death  Father    Diabetes mellitus  N  Mother    Hypertension  N  Mother    Cancer, lung  N  Mother    Cancer, brain  N    SOCIAL HISTORY  (Detailed) Tobacco use reviewed. Preferred language is  Unknown.   Smoking status: Current every day smoker.  SMOKING STATUS Use Status Type Smoking Status Usage Per Day Years Used Total Pack Years  yes Cigarette Current every day smoker 1 Packs     TOBACCO CESSATION INFORMATION Date Order Status Description Code Tobacco Cessation Information  02/23/2016     Smoking cessation education    HOME ENVIRONMENT/SAFETY The patient has not fallen in the last year.        MEDICATIONS(added, continued or stopped this visit): Started Medication Directions Instruction Stopped  01/08/2014 Elavil 25mg  ORAL Take 3 tablets an hour before bedtime    01/08/2014 oxycodone 5 mg tablet take 1 tablet by oral route  every 6 hours as needed       ALLERGIES: Ingredient Reaction Medication Name Comment  NO KNOWN ALLERGIES     No known allergies.   REVIEW OF SYSTEMS System Neg/Pos Details  Constitutional Negative Chills, fatigue, fever, malaise, night sweats, weight gain and weight loss.  ENMT Positive Otalgia, Ringing in ears.  ENMT Negative Ear drainage, hearing loss, nasal drainage, sinus pressure and sore throat.  Eyes Negative Eye discharge, eye pain and vision changes.  Respiratory Negative Chronic cough, cough, dyspnea, known TB exposure and wheezing.  Cardio Negative Chest pain, claudication, edema and irregular heartbeat/palpitations.  GI Positive Nausea, Vomiting.  GI Negative Abdominal pain, blood in stool, change in stool pattern, constipation, decreased appetite, diarrhea and heartburn.  GU Negative Dysuria, hematuria, hot flashes, irregular menses, polyuria, urinary frequency, urinary incontinence  and urinary retention.  Endocrine Negative Cold intolerance, heat intolerance, polydipsia and polyphagia.  Neuro Negative Dizziness, extremity weakness, gait disturbance, headache, memory impairment, numbness in extremity, seizures and tremors.  Psych Positive Anxiety, Depression.  Psych Negative Insomnia.  Integumentary Negative Brittle hair,  brittle nails, change in shape/size of mole(s), hair loss, hirsutism, hives, pruritus, rash and skin lesion.  MS Positive Neck pain, Arm pain and leg pain.  MS Negative Back pain, joint pain, joint swelling and muscle weakness.  Hema/Lymph Negative Easy bleeding, easy bruising and lymphadenopathy.  Allergic/Immuno Negative Contact allergy, environmental allergies, food allergies and seasonal allergies.  Reproductive Negative Breast discharge, breast lump(s), dysmenorrhea, dyspareunia, history of abnormal PAP smear and vaginal discharge.     Vitals Date Temp F BP Pulse Ht In Wt Lb BMI BSA Pain Score  02/23/2016  120/85 98 69 199 29.39  5/10      IMPRESSION The patient is experiencing headaches, facial numbness, balance changes, and severe ear pain. On review of her MRI, she has a Chiari malformation that goes down to the C2 level of her cervical spine. On physical exam, she has a nystagmus in both eyes and on confrontational testing her strength and reflexes are normal. I recommend a Chiari decompression for alleviation of her symptoms.   Pain Assessment/Treatment Pain Scale: 5/10. Method: Numeric Pain Intensity Scale. Location: head. Onset: 02/23/2011. Duration: varies. Quality: discomforting. Pain Assessment/Treatment follow-up plan of care: Patient is taking medications prescribed.  Fall Risk Plan The patient has not fallen in the last year.  We discussed smoking cessation. Schedule Chiari decompression for 6/23. Nurse education given.              Provider:  Danae OrleansJoseph D. Venetia MaxonStern MD  02/23/2016 04:15 PM Dictation edited by: Danae OrleansJoseph D. [facility]    CC Providers: PA  [facility] [address] [place],  [place]  [id number]-   Marikay Alaravid Jones MD [address] [place], [place] [id number]              Electronically signed by Danae OrleansJoseph D. Venetia MaxonStern MD on 02/23/2016 05:25 PM

## 2016-03-10 NOTE — Brief Op Note (Signed)
03/10/2016  11:06 AM  PATIENT:  Rhonda FilesShannon R Borawski  30 y.o. 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  POST-OPERATIVE DIAGNOSIS:  Chiari malformation type I  PROCEDURE:  Procedure(s): Chiari Decompression with cervical laminectomy (N/A)  SURGEON:  Surgeon(s) and Role:    * Maeola HarmanJoseph Vitaliy Eisenhour, MD - Primary    * Julio SicksHenry Pool, MD - Assisting  PHYSICIAN ASSISTANT:   ASSISTANTS: Poteat, RN   ANESTHESIA:   general  EBL:  Total I/O In: 700 [I.V.:700] Out: 725 [Urine:525; Blood:200]  BLOOD ADMINISTERED:none  DRAINS: none   LOCAL MEDICATIONS USED:  MARCAINE    and LIDOCAINE   SPECIMEN:  No Specimen  DISPOSITION OF SPECIMEN:  N/A  COUNTS:  YES  TOURNIQUET:  * No tourniquets in log *  DICTATION: DICTATION:   Indications:  Patient is 30 year old female with intractable headaches and Chiari I Malformation.  It was elected to take her to surgery for Chiari decompression with cervical laminectomy and Dural patch graft.  Cerebellar tonsils extended down to mid-C2.  Procedure:  Following smooth and uncomplicated induction of general endotracheal anesthesia and placement of Foley, intravenous and arterial catheters, patient was placed in 3 pin fixation and turned prone on the operating table and padded appropriately.  Her posterior neck and was shaved, then prepped and draped with betadine scrub and Duraprep.  Area of planned incision was infiltrated with lidocaine.  Incision was made and carried through avascular midline plane to expose suboccipital region, C 1 and C 2 laminae. High speed drill was used to thin suboccipital bone and posterior arch of C 1, which was found to be discontinuous, then bone was removed with Kerrison rongeurs.  Total laminectomy of C 1 and C 2 was performed.  Under operating microscope, Dura was opened with 15 blade and carried over cerebellar hemispheres.  Arachnoid was opened and CSF was found to be flowing freely.   Liga-clips were placed for Dural hemostasis.  A dural patch graft of DuraMatrix was then fashioned and sutured into place with running 6-0 prolene sutures.  Closure was tested and found to be water tight.  Dura seal was placed over suture line.  Wound was closed with 0, 2-0 Vicryls and 3-0 Nylon stitch.  Wound was dressed with sterile occlusive dressing, taken out of pins and trasferred to OR stretcher in stable and satisfactory condition having tolerated procedure well.  Counts were correct at end of case.   PLAN OF CARE: Admit to inpatient   PATIENT DISPOSITION:  PACU - hemodynamically stable.   Delay start of Pharmacological VTE agent (>24hrs) due to surgical blood loss or risk of bleeding: yes

## 2016-03-10 NOTE — Anesthesia Postprocedure Evaluation (Signed)
Anesthesia Post Note  Patient: Rhonda Key  Procedure(s) Performed: Procedure(s) (LRB): Chiari Decompression with cervical laminectomy (N/A)  Patient location during evaluation: PACU Anesthesia Type: General Level of consciousness: awake and alert Pain management: pain level controlled Vital Signs Assessment: post-procedure vital signs reviewed and stable Respiratory status: spontaneous breathing, nonlabored ventilation, respiratory function stable and patient connected to nasal cannula oxygen Cardiovascular status: blood pressure returned to baseline and stable Postop Assessment: no signs of nausea or vomiting Anesthetic complications: no    Last Vitals:  Filed Vitals:   03/10/16 1645 03/10/16 1700  BP:  129/74  Pulse: 68 62  Temp: 36.2 C 36.9 C  Resp: 23 21    Last Pain:  Filed Vitals:   03/10/16 1708  PainSc: 8                  Brylin Stanislawski J

## 2016-03-10 NOTE — Anesthesia Procedure Notes (Signed)
Procedure Name: Intubation Date/Time: 03/10/2016 8:18 AM Performed by: Faustino CongressWHITE, Una Yeomans TENA Olevia Westervelt Pre-anesthesia Checklist: Patient identified, Emergency Drugs available, Suction available and Patient being monitored Patient Re-evaluated:Patient Re-evaluated prior to inductionOxygen Delivery Method: Circle System Utilized Preoxygenation: Pre-oxygenation with 100% oxygen Intubation Type: IV induction Ventilation: Mask ventilation without difficulty Laryngoscope Size: Mac and 3 Grade View: Grade I Tube type: Oral Tube size: 7.0 mm Number of attempts: 1 Airway Equipment and Method: Stylet Placement Confirmation: ETT inserted through vocal cords under direct vision,  positive ETCO2 and breath sounds checked- equal and bilateral Secured at: 22 cm Tube secured with: Tape Dental Injury: Teeth and Oropharynx as per pre-operative assessment

## 2016-03-10 NOTE — Op Note (Signed)
03/10/2016  11:06 AM  PATIENT:  Rhonda Key  30 y.o. 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  POST-OPERATIVE DIAGNOSIS:  Chiari malformation type I  PROCEDURE:  Procedure(s): Chiari Decompression with cervical laminectomy (N/A)  SURGEON:  Surgeon(s) and Role:    * Jaquavian Firkus, MD - Primary    * Henry Pool, MD - Assisting  PHYSICIAN ASSISTANT:   ASSISTANTS: Poteat, RN   ANESTHESIA:   general  EBL:  Total I/O In: 700 [I.V.:700] Out: 725 [Urine:525; Blood:200]  BLOOD ADMINISTERED:none  DRAINS: none   LOCAL MEDICATIONS USED:  MARCAINE    and LIDOCAINE   SPECIMEN:  No Specimen  DISPOSITION OF SPECIMEN:  N/A  COUNTS:  YES  TOURNIQUET:  * No tourniquets in log *  DICTATION: DICTATION:   Indications:  Patient is 30 year old female with intractable headaches and Chiari I Malformation.  It was elected to take her to surgery for Chiari decompression with cervical laminectomy and Dural patch graft.  Cerebellar tonsils extended down to mid-C2.  Procedure:  Following smooth and uncomplicated induction of general endotracheal anesthesia and placement of Foley, intravenous and arterial catheters, patient was placed in 3 pin fixation and turned prone on the operating table and padded appropriately.  Her posterior neck and was shaved, then prepped and draped with betadine scrub and Duraprep.  Area of planned incision was infiltrated with lidocaine.  Incision was made and carried through avascular midline plane to expose suboccipital region, C 1 and C 2 laminae. High speed drill was used to thin suboccipital bone and posterior arch of C 1, which was found to be discontinuous, then bone was removed with Kerrison rongeurs.  Total laminectomy of C 1 and C 2 was performed.  Under operating microscope, Dura was opened with 15 blade and carried over cerebellar hemispheres.  Arachnoid was opened and CSF was found to be flowing freely.   Liga-clips were placed for Dural hemostasis.  A dural patch graft of DuraMatrix was then fashioned and sutured into place with running 6-0 prolene sutures.  Closure was tested and found to be water tight.  Dura seal was placed over suture line.  Wound was closed with 0, 2-0 Vicryls and 3-0 Nylon stitch.  Wound was dressed with sterile occlusive dressing, taken out of pins and trasferred to OR stretcher in stable and satisfactory condition having tolerated procedure well.  Counts were correct at end of case.   PLAN OF CARE: Admit to inpatient   PATIENT DISPOSITION:  PACU - hemodynamically stable.   Delay start of Pharmacological VTE agent (>24hrs) due to surgical blood loss or risk of bleeding: yes  

## 2016-03-10 NOTE — Interval H&P Note (Signed)
History and Physical Interval Note:  03/10/2016 8:00 AM  Rhonda Key  has presented today for surgery, with the diagnosis of Chiari malformation type I  The various methods of treatment have been discussed with the patient and family. After consideration of risks, benefits and other options for treatment, the patient has consented to  Procedure(s) with comments: Chiari Decompression with cervical laminectomy (N/A) - Chiari Decompression with cervical laminectomy as a surgical intervention .  The patient's history has been reviewed, patient examined, no change in status, stable for surgery.  I have reviewed the patient's chart and labs.  Questions were answered to the patient's satisfaction.     [name]

## 2016-03-11 MED ORDER — FAMOTIDINE 20 MG PO TABS
20.0000 mg | ORAL_TABLET | Freq: Two times a day (BID) | ORAL | Status: DC
  Administered 2016-03-11 – 2016-03-13 (×4): 20 mg via ORAL
  Filled 2016-03-11 (×4): qty 1

## 2016-03-11 NOTE — Progress Notes (Deleted)
Discharged home with Home Health services. Family at bedside at the time of D/C.

## 2016-03-11 NOTE — Progress Notes (Signed)
Postop day 1. Patient complains of some headache and neck pain. No other neurologic symptoms.  Afebrile. Vitals are stable. Awake and alert. Oriented and appropriate. Cranial nerve function intact. Motor and sensory function extremities normal. Wound clean and dry.  Doing well status post Chiari decompression. Transfer to floor. Mobilize.

## 2016-03-12 NOTE — Progress Notes (Signed)
Postop day 2. Still with some headache but this is improving. Neck pain better. No wound drainage. Awake and alert. Oriented and appropriate. Motor and sensory function intact.  Afebrile. Her vitals are stable.  Dressing well following Chiari decompression. Continue efforts at mobilization and pain control. Possible discharge tomorrow.

## 2016-03-13 ENCOUNTER — Encounter (HOSPITAL_COMMUNITY): Payer: Self-pay | Admitting: Neurosurgery

## 2016-03-13 MED ORDER — HYDROMORPHONE HCL 2 MG PO TABS: 1.0000 mg | ORAL_TABLET | ORAL | Status: DC | PRN

## 2016-03-13 MED ORDER — HYDROMORPHONE HCL 2 MG PO TABS
1.0000 mg | ORAL_TABLET | ORAL | Status: DC | PRN
  Administered 2016-03-13: 2 mg via ORAL
  Filled 2016-03-13: qty 1

## 2016-03-13 MED ORDER — TIZANIDINE HCL 2 MG PO TABS: 2.0000 mg | ORAL_TABLET | Freq: Four times a day (QID) | ORAL | Status: DC | PRN

## 2016-03-13 NOTE — Progress Notes (Signed)
Subjective: Patient reports still some head pain and has yet to move bowels.  Objective: Vital signs in last 24 hours: Temp:  [98.2 F (36.8 C)-99.3 F (37.4 C)] 98.2 F (36.8 C) (06/26 0647) Pulse Rate:  [50-74] 62 (06/26 0647) Resp:  [18-20] 18 (06/26 0647) BP: (110-119)/(56-76) 110/60 mmHg (06/26 0647) SpO2:  [96 %-100 %] 99 % (06/26 0647)  Intake/Output from previous day: 06/25 0701 - 06/26 0700 In: 2265 [P.O.:240; I.V.:2025] Out: -  Intake/Output this shift:    Physical Exam: Dressing CDI.  MAEW without difficulty.  Lab Results: No results for input(s): WBC, HGB, HCT, PLT in the last 72 hours. BMET No results for input(s): NA, K, CL, CO2, GLUCOSE, BUN, CREATININE, CALCIUM in the last 72 hours.  Studies/Results: No results found.  Assessment/Plan: Improving.  Will work on analgesia and bowels.  OK to D/C home this pm.    LOS: 3 days    Rhonda Key,Rhonda Key D, MD 03/13/2016, 8:27 AM

## 2016-03-13 NOTE — Care Management Note (Signed)
Case Management Note  Patient Details  Name: Rhonda Key MRN: [id number] Date of Birth: [birth year]  Subjective/Objective:                    Action/Plan: Patient discharging home with self care. No further needs per CM.   Expected Discharge Date:                  Expected Discharge Plan:  Home/Self Care  In-House Referral:     Discharge planning Services     Post Acute Care Choice:    Choice offered to:     DME Arranged:    DME Agency:     HH Arranged:    HH Agency:     Status of Service:  Completed, signed off  If discussed at [employer] Length of Stay Meetings, dates discussed:    Additional Comments:  Kermit BaloKelli F Mccabe Gloria, RN 03/13/2016, 1:54 PM

## 2016-03-13 NOTE — Progress Notes (Signed)
Pt discharged home with family. IV discontinued earlier in the day. Discharge information and prescriptions given. Pt questions asked and answered. Pt awaiting transportation before leaving unit. Lawson RadarHeather M Denham Mose

## 2016-03-13 NOTE — Discharge Summary (Signed)
Physician Discharge Summary  Patient ID: Rhonda Key MRN: [id number] DOB/AGE: 1985-09-22 30 y.o.  Admit date: 03/10/2016 Discharge date: 03/13/2016  Admission Diagnoses:Chiari Malformation  Discharge Diagnoses: Same Active Problems:   Chiari I malformation [facility](HCC)   Discharged Condition: good  Hospital Course: Patient underwent Chiari decompression and cervical laminectomy with dural patch grafting, from which she did well.  She was mobilized, initially observed in ICU, then discharged home on postop day 3.  Consults: None  Significant Diagnostic Studies: None  Treatments: surgery:Chiari decompression and cervical laminectomy with dural patch grafting  Discharge Exam: Blood pressure 110/60, pulse 62, temperature 98.2 F (36.8 C), temperature source Oral, resp. rate 18, height 5\' 10"  (1.778 m), weight 94.7 kg (208 lb 12.4 oz), SpO2 99 %. Neurologic: Alert and oriented X 3, normal strength and tone. Normal symmetric reflexes. Normal coordination and gait Wound:CDI  Disposition: Home     Medication List    TAKE these medications        amitriptyline 25 MG tablet  Commonly known as:  ELAVIL  Take 1 tablet (25 mg total) by mouth at bedtime.     aspirin-acetaminophen-caffeine 250-250-65 MG tablet  Commonly known as:  EXCEDRIN MIGRAINE  Take 1 tablet by mouth every 6 (six) hours as needed for headache or migraine.     etonogestrel 68 MG Impl implant  Commonly known as:  NEXPLANON  1 each by Subdermal route once. Reported on 03/10/2016     HYDROmorphone 2 MG tablet  Commonly known as:  DILAUDID  Take 0.5-1 tablets (1-2 mg total) by mouth every 3 (three) hours as needed for moderate pain or severe pain.     LORazepam 0.5 MG tablet  Commonly known as:  ATIVAN  Take 1 tablet (0.5 mg total) by mouth 2 (two) times daily as needed for anxiety.     meclizine 25 MG tablet  Commonly known as:  ANTIVERT  Take 1 tablet (25 mg total) by mouth 3 (three) times daily as needed  for dizziness.     tiZANidine 2 MG tablet  Commonly known as:  ZANAFLEX  Take 1-2 tablets (2-4 mg total) by mouth every 6 (six) hours as needed for muscle spasms.         Signed: Dorian HeckleSTERN,Irisha Grandmaison D, MD 03/13/2016, 8:29 AM

## 2016-04-10 ENCOUNTER — Encounter: Payer: Self-pay | Admitting: Neurology

## 2016-04-10 ENCOUNTER — Ambulatory Visit (INDEPENDENT_AMBULATORY_CARE_PROVIDER_SITE_OTHER): Payer: BLUE CROSS/BLUE SHIELD | Admitting: Neurology

## 2016-04-10 VITALS — BP 116/70 | HR 95 | Ht 70.0 in | Wt 201.0 lb

## 2016-04-10 DIAGNOSIS — G935 Compression of brain: Secondary | ICD-10-CM | POA: Diagnosis not present

## 2016-04-10 NOTE — Patient Instructions (Signed)
I would like to see how you continue to do over the next month or so.  Follow up in September and we can discuss if we need to start another medication for headache.

## 2016-04-10 NOTE — Progress Notes (Signed)
NEUROLOGY FOLLOW UP OFFICE NOTE  Rhonda Key [id number]  HISTORY OF PRESENT ILLNESS: Rhonda Key is a 30 year old right-handed female with history of L5-S1 surgery x2 who follows up for exertional headache, paresthesias and Arnold-Chiari malformation.  UPDATE: For headache, amitriptyline was increased but headaches persisted.  To evaluate exertional headache with numbness and tingling of lower face and arms, an MRI of brain with and without contrast was performed on 01/11/16, which was personally reviewed and revealed an Arnold-Chiari malformation with cerebellar tonsils extending 24 mm below the foramen magnum.  MRA of head was negative.  MRI of cervical spine with and without contrast from 01/17/16 demonstrated no cord edema or syrinx.  She was evaluated by Dr. Venetia Maxon at [facility] and underwent Chiari decompression and cervical laminectomy on 03/13/16.  She currently takes Mobic for inflammation, tizanidine, and oxycodone as needed for pain.  She is doing well.  Headaches are more mild and less frequent.  She had a minor setback a couple of weeks ago, in which she stretched and triggered the headaches again.  But they are still not like prior to surgery.  Sometimes, she feels like she has mild trouble swallowing but nothing significant or frequent.  She no longer has paresthesias in the face and arms.  HISTORY: Onset:  2011 Location:  Top of head, either side, frontal Quality:  Pressure, sharp stabbing.   Intensity:  10/10 Aura:  no Prodrome:  no Associated symptoms:  Sometimes photophobia or phonophobia.  No nausea or visual disturbance such as vision loss, blurred vision, or diplopia. Duration:  From 3 seconds to 30 minutes Frequency:  Several times a day. Triggers/exacerbating factors:  Initially it only occurred with change in position (such as bending over) or exertion (cough, exercise, valsalva).  Then, it would occur spontaneously but these activities still trigger or  exacerbate it. Relieving factors:  Laying flat. Activity:  Lays down When the pain is lessened, she notes numbness and tingling involving the lower half of her face bilaterally, as well as lower arms and hands.  She denies neck pain or radicular pain down the arms.  She has some residual numbness and bladder dysfunction from lumbosacral surgery, but nothing new.  Past NSAIDS:  Advil, Aleve Past analgesics:  Rhonda Key Past abortive triptans:  no Past muscle relaxants:  Baclofen Past anti-emetic:  no Past anti-anxiolytic:  no Past sleep aide:  no Past antihypertensive medications:  no Past antidepressant medications:  no Past anticonvulsant medications:  no Past vitamins/Herbal/Supplements:  no Past antihistamines/decongestants:  no Other past medications:  no  Current NSAIDS:  none Current analgesics:  Acetaminophen, Excedrin  Current triptans:  no Current anti-emetic:  no Current muscle relaxants:  no Current anti-anxiolytic:  no Current sleep aide:  no Current Antihypertensive medications:  no Current Antidepressant medications:  Amitriptyline  (restarted it a month ago.  She was previously on it up to  daily for nerve pain following her surgeries in 2014.  Headaches may have been less severe during that time). Current Anticonvulsant medications:  no Current Vitamins/Herbal/Supplements:  no Current Antihistamines/Decongestants:  no Other therapy:  no  Caffeine:  She tried stopping caffeine for a year and it didn't help.  She drinks soda again Alcohol:  no Smoker:  yes Diet:  hydrates Exercise:  no Depression/stress:  yes Sleep hygiene:  good Family history of headache:  Mom had headaches  PAST MEDICAL HISTORY: Past Medical History:  Diagnosis Date  . Acute back pain with sciatica   .  Anxiety   . Depression   . Migraine   . Numbness    face  . Numbness and tingling in hands   . Numbness and tingling of both legs   . Numbness and tingling of right leg   .  Ringing in ears   . Urinary incontinence     MEDICATIONS: Current Outpatient Prescriptions on File Prior to Visit  Medication Sig Dispense Refill  . etonogestrel (NEXPLANON) 68 MG IMPL implant 1 each by Subdermal route once. Reported on 03/10/2016    . tiZANidine (ZANAFLEX) 2 MG tablet Take 1-2 tablets (2-4 mg total) by mouth every 6 (six) hours as needed for muscle spasms. 60 tablet 0   No current facility-administered medications on file prior to visit.     ALLERGIES: No Known Allergies  FAMILY HISTORY: Family History  Problem Relation Age of Onset  . Migraines Mother     SOCIAL HISTORY: Social History   Social History  . Marital status: Single    Spouse name: N/A  . Number of children: N/A  . Years of education: N/A   Occupational History  . Not on file.   Social History Main Topics  . Smoking status: Former Smoker    Packs/day: 1.00    Years: 8.00    Types: Cigarettes    Quit date: 03/05/2016  . Smokeless tobacco: Never Used     Comment: "really trying to quit"  . Alcohol use No  . Drug use:     Types: Marijuana     Comment: daily  . Sexual activity: Yes    Birth control/ protection: None   Other Topics Concern  . Not on file   Social History Narrative  . No narrative on file    REVIEW OF SYSTEMS: Constitutional: No fevers, chills, or sweats, no generalized fatigue, change in appetite Eyes: No visual changes, double vision, eye pain Ear, nose and throat: as above Cardiovascular: No chest pain, palpitations Respiratory:  No shortness of breath at rest or with exertion, wheezes GastrointestinaI: No nausea, vomiting, diarrhea, abdominal pain, fecal incontinence Genitourinary:  No dysuria, urinary retention or frequency Musculoskeletal:  Neck pain Integumentary: No rash, pruritus, skin lesions Neurological: as above Psychiatric: No depression, insomnia, anxiety Endocrine: No palpitations, fatigue, diaphoresis, mood swings, change in appetite, change  in weight, increased thirst Hematologic/Lymphatic:  No purpura, petechiae. Allergic/Immunologic: no itchy/runny eyes, nasal congestion, recent allergic reactions, rashes  PHYSICAL EXAM: Vitals:   04/10/16 1459  BP: 116/70  Pulse: 95   General: No acute distress.  Patient appears well-groomed.  normal body habitus. Head:  Normocephalic/atraumatic Eyes:  Fundi examined but not visualized Neck: post-surgical scar Heart:  Regular rate and rhythm Lungs:  Clear to auscultation bilaterally Back: No paraspinal tenderness Neurological Exam: alert and oriented to person, place, and time. Attention span and concentration intact, recent and remote memory intact, fund of knowledge intact.  Speech fluent and not dysarthric, language intact.  CN II-XII intact. Bulk and tone normal, muscle strength 5/5 throughout.  Sensation to light touch, temperature and vibration intact.  Deep tendon reflexes 2+ throughout, toes downgoing.  Finger to nose and heel to shin testing intact.  Gait normal.  IMPRESSION: Arnold-Chiari malformation status post decompression  PLAN: Will continue to monitor as she continues to heal She follows up with Dr. Venetia Maxon on 05/15/16.  She will follow up with me in September.  If headaches still persist, will consider starting a preventative such as Topamax.  15 minutes spent face to face with  patient, over 50% spent discussing management.  Shon Millet, DO  CC:  Wallis Bamberg, PA-C

## 2016-05-22 IMAGING — MR MR CERVICAL SPINE WO/W CM
5 of 9 series · 19 of 48 positions shown · IV contrast (18ml multihance)
Comparison: Brain MRI 01/11/2016

CLINICAL DATA: Stull malformation noted on recent brain MRI.
Headaches and numbness in hands and face for 6 years.

EXAM:
MRI CERVICAL SPINE WITHOUT AND WITH CONTRAST
TECHNIQUE: Multiplanar and multiecho pulse sequences of the cervical spine, to
include the craniocervical junction and cervicothoracic junction,
were obtained according to standard protocol without and with
intravenous contrast.
CONTRAST:  18mL MULTIHANCE GADOBENATE DIMEGLUMINE 529 MG/ML IV SOLN

[Series 3: T2 · sagittal · 3.0mm · 0.39mm/px · 4 of 12 slices shown (1 of 3)]
[im 1/12]
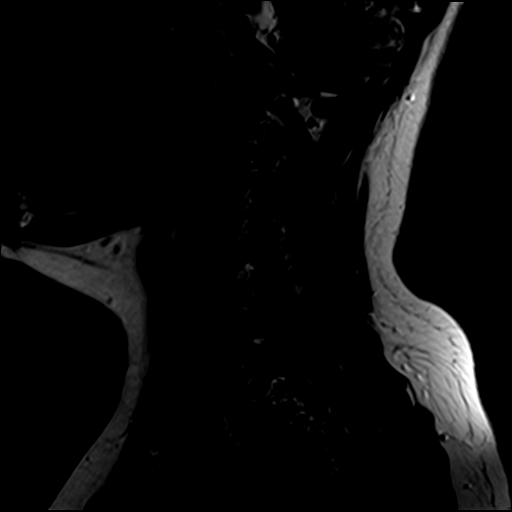
[im 4/12]
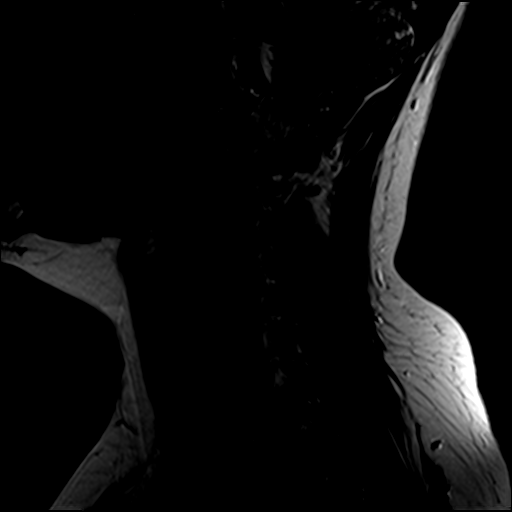
[im 8/12]
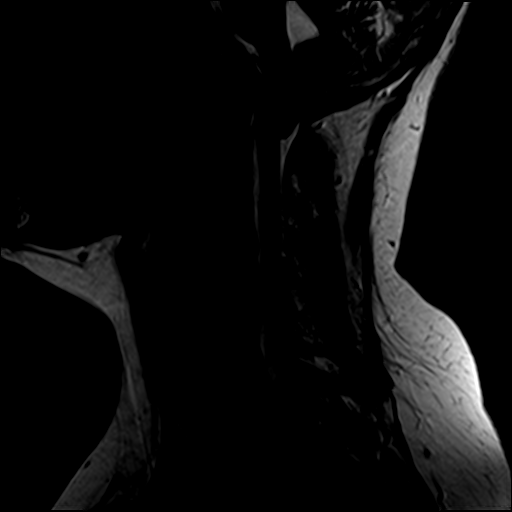
[im 12/12]
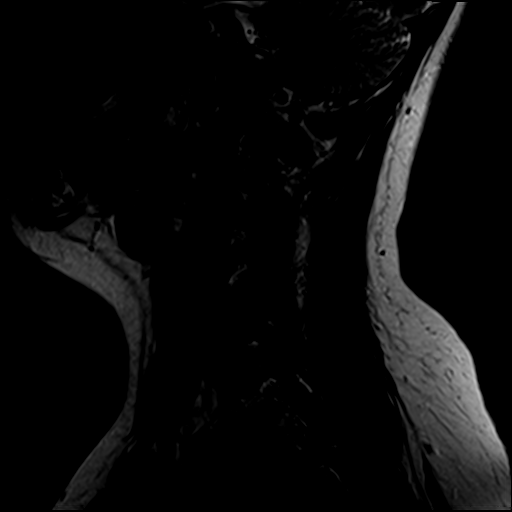

[Series 4: T1 · sagittal · 3.0mm · 0.39mm/px · 3 of 12 slices shown (1 of 2)]
[im 1/12]
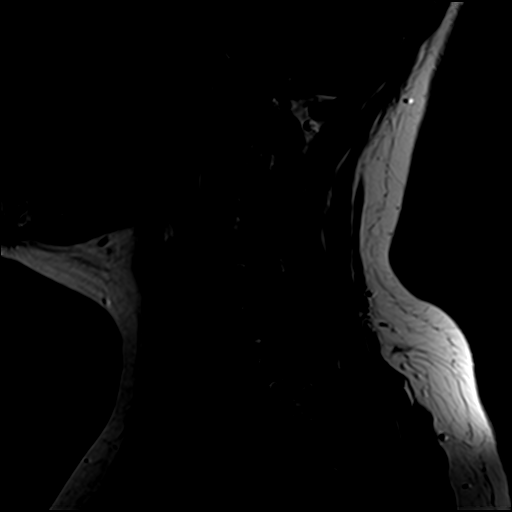
[im 6/12]
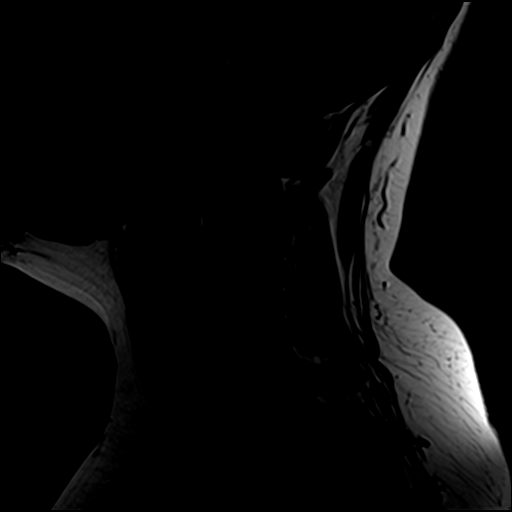
[im 12/12]
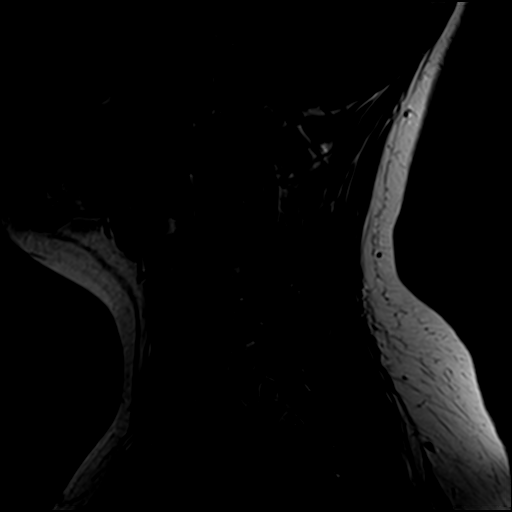

[Series 5: T2 · sagittal · 3.0mm · 0.39mm/px · 3 of 12 slices shown (2 of 3)]
[im 1/12]
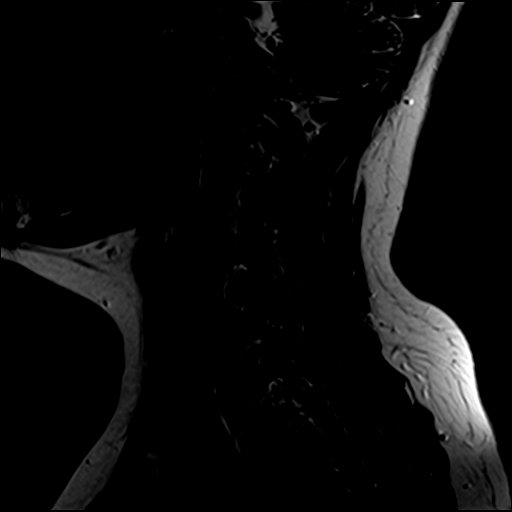
[im 6/12]
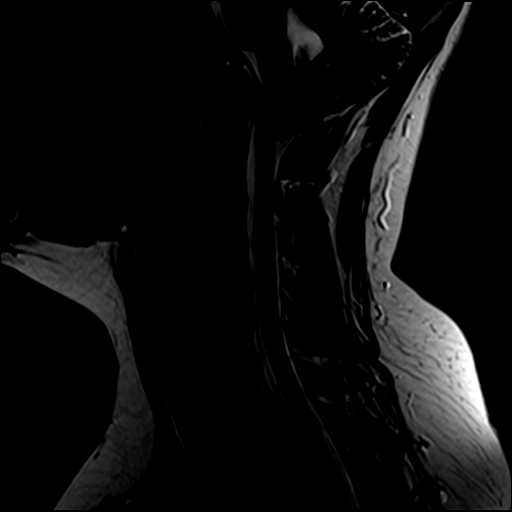
[im 12/12]
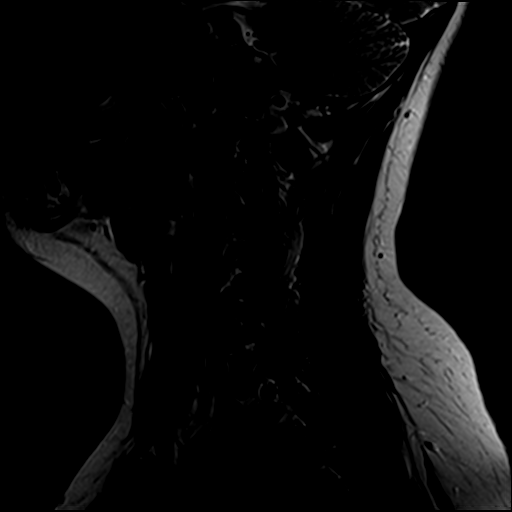

[Series 6: T2 · axial · 3.0mm · 0.33mm/px · z∈[-46,+54]mm · 8 of 27 slices shown (3 of 3)]
[im 1/27]
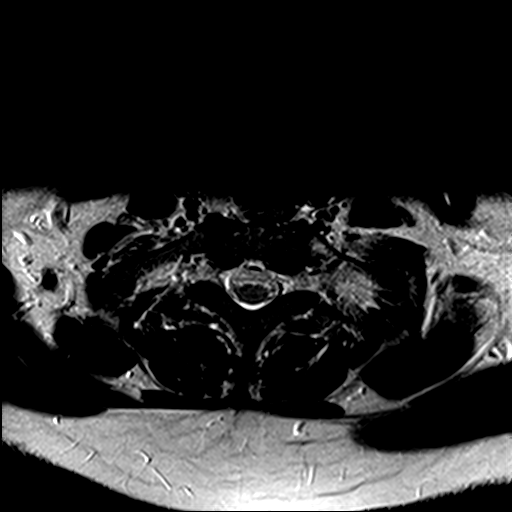
[im 4/27]
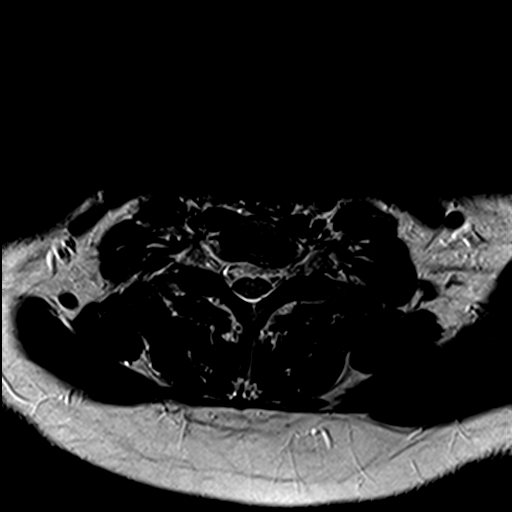
[im 8/27]
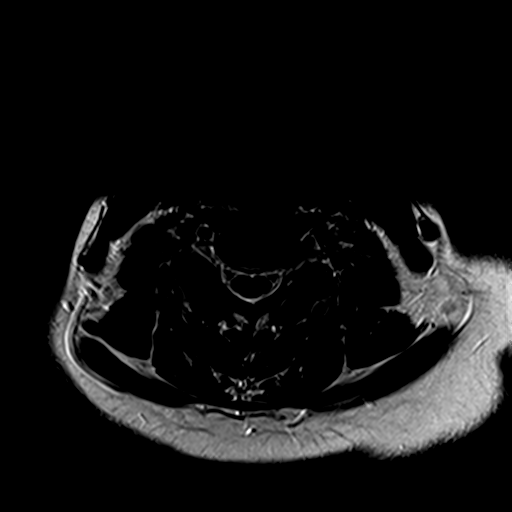
[im 12/27]
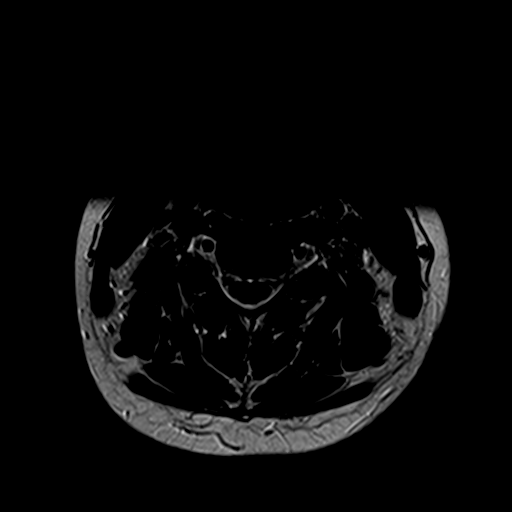
[im 15/27]
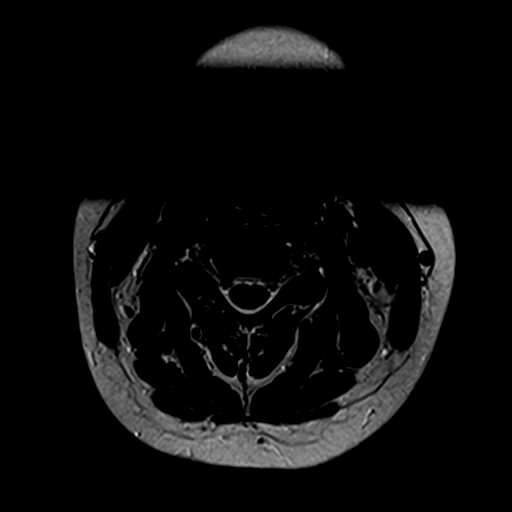
[im 19/27]
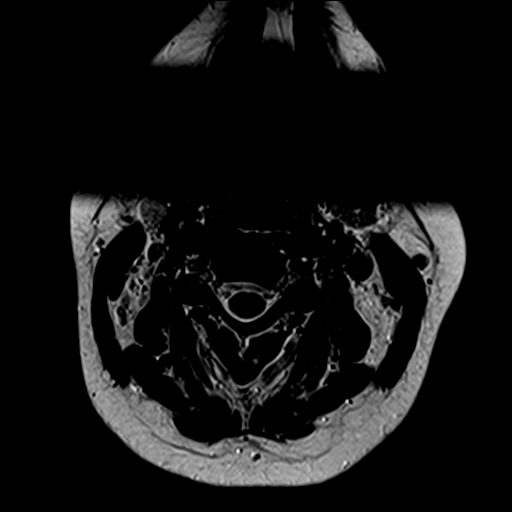
[im 23/27]
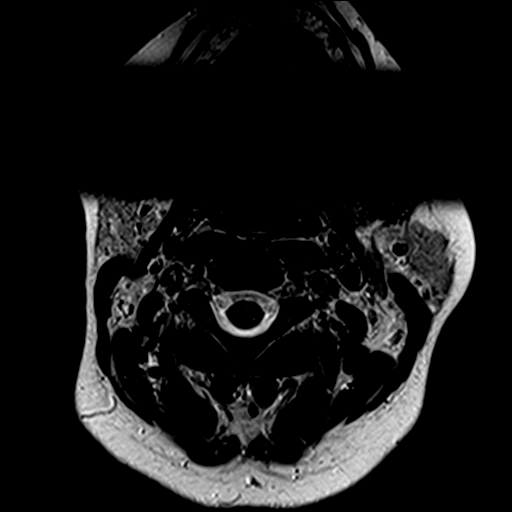
[im 27/27]
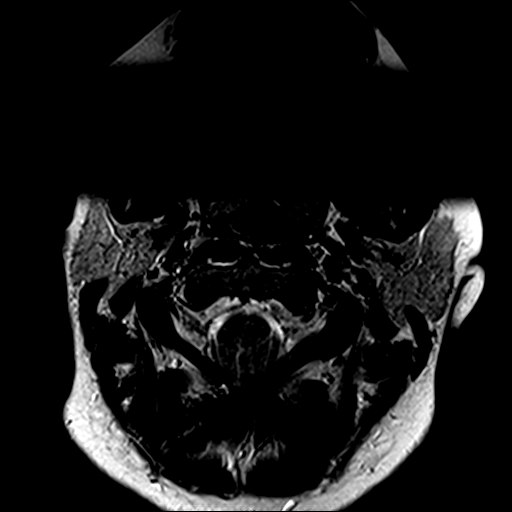

[Series 9: T1 · axial · 3.0mm · 0.35mm/px · 1 of 27 slices shown (2 of 2)]
[im 1/27]
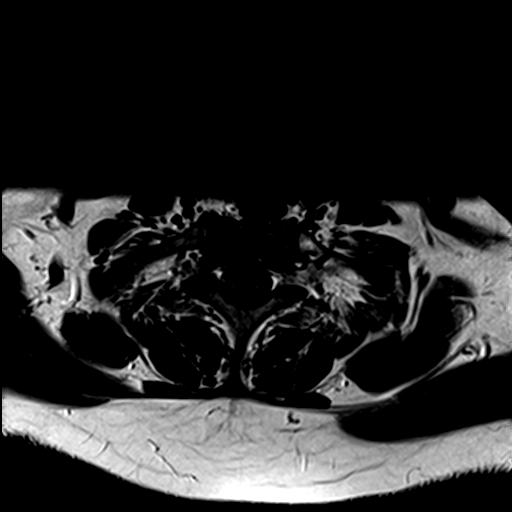

[19 of 48 positions shown; findings below may reference images not displayed]

FINDINGS: Chiari 1 malformation is again demonstrated with 21 mm of tonsillar
ectopia. No cord edema or syrinx. The vertebral bodies are normally
aligned and demonstrate normal marrow signal. The facets are
normally aligned.

C2-3:  No significant findings.

C3-4:  No significant findings.

C4-5:  No significant findings.

C5-6: Shallow broad-based disc protrusion and mild osteophytic
ridging with mass effect on the ventral thecal sac and mild
narrowing of the ventral CSF space. No foraminal stenosis.

C6-7: Shallow disc osteophyte complex on the left with mild left
foraminal encroachment. No spinal stenosis.

C7-T1: No significant findings.
IMPRESSION: 1. Chiari 1 malformation with 21 mm of tonsillar ectopia.
2. No cord edema or syrinx.
3. Broad-based disc protrusion at C5-6.
4. Shallow disc osteophyte complex on the left at C6-7.

## 2016-06-16 ENCOUNTER — Ambulatory Visit (INDEPENDENT_AMBULATORY_CARE_PROVIDER_SITE_OTHER): Payer: BLUE CROSS/BLUE SHIELD | Admitting: Neurology

## 2016-06-16 ENCOUNTER — Encounter: Payer: Self-pay | Admitting: Neurology

## 2016-06-16 VITALS — BP 124/66 | HR 68 | Ht 69.0 in | Wt 196.0 lb

## 2016-06-16 DIAGNOSIS — Z720 Tobacco use: Secondary | ICD-10-CM | POA: Diagnosis not present

## 2016-06-16 DIAGNOSIS — M25531 Pain in right wrist: Secondary | ICD-10-CM

## 2016-06-16 DIAGNOSIS — G935 Compression of brain: Secondary | ICD-10-CM | POA: Diagnosis not present

## 2016-06-16 NOTE — Progress Notes (Signed)
NEUROLOGY FOLLOW UP OFFICE NOTE  Rhonda Key [id number]  HISTORY OF PRESENT ILLNESS: Rhonda Key is a 30 year old right-handed female with history of L5-S1 surgery x2 who follows up for Arnold-Chiari malformation.   UPDATE: She is doing well overall.  She still hears a "crunchy glass" sound in her ears.  Every other day, she has a mild headache "like I had just been crying", which lasts 1 to 2 hours, but nothing excruciating like prior to the surgery.  She sometimes notes a chest tightness, which has improved.  Dr. Venetia Maxon thinks she may have some irritation of the meninges due to the surgery and she is still healing.  Of note, she reports pain along the radial aspect of the right wrist which radiates into the thenar eminence.  Sometimes the pain causes her to drop objects.  She denies numbness and tingling.  HISTORY: Onset:  2011 Location:  Top of head, either side, frontal Quality:  Pressure, sharp stabbing.   Intensity:  10/10 Aura:  no Prodrome:  no Associated symptoms:  Sometimes photophobia or phonophobia.  No nausea or visual disturbance such as vision loss, blurred vision, or diplopia. Duration:  From 3 seconds to 30 minutes Frequency:  Several times a day. Triggers/exacerbating factors:  Initially it only occurred with change in position (such as bending over) or exertion (cough, exercise, valsalva).  Then, it would occur spontaneously but these activities still trigger or exacerbate it. Relieving factors:  Laying flat. Activity:  Lays down When the pain is lessened, she notes numbness and tingling involving the lower half of her face bilaterally, as well as lower arms and hands.  She denies neck pain or radicular pain down the arms.  She has some residual numbness and bladder dysfunction from lumbosacral surgery, but nothing new.   Past NSAIDS:  Advil, Aleve Past analgesics:  Marlin Canary Past abortive triptans:  no Past muscle relaxants:  Baclofen Past anti-emetic:   no Past anti-anxiolytic:  no Past sleep aide:  no Past antihypertensive medications:  no Past antidepressant medications:  no Past anticonvulsant medications:  no Past vitamins/Herbal/Supplements:  no Past antihistamines/decongestants:  no Other past medications:  no   Current NSAIDS:  none Current analgesics:  Acetaminophen, Excedrin  Current triptans:  no Current anti-emetic:  no Current muscle relaxants:  no Current anti-anxiolytic:  no Current sleep aide:  no Current Antihypertensive medications:  no  Current Antidepressant medications:  Amitriptyline 10mg  (restarted it a month ago.  She was previously on it up to 75mg  daily for nerve pain following her surgeries in 2014.  Headaches may have been less severe during that time). Current Anticonvulsant medications:  no Current Vitamins/Herbal/Supplements:  no Current Antihistamines/Decongestants:  no Other therapy:  no   For headache, amitriptyline was increased but headaches persisted.  To evaluate exertional headache with numbness and tingling of lower face and arms, an MRI of brain with and without contrast was performed on 01/11/16, which was personally reviewed and revealed an Arnold-Chiari malformation with cerebellar tonsils extending 24 mm below the foramen magnum.  MRA of head was negative.  MRI of cervical spine with and without contrast from 01/17/16 demonstrated no cord edema or syrinx.  She was evaluated by Dr. Venetia Maxon at [facility] and underwent Chiari decompression and cervical laminectomy on 03/13/16.    PAST MEDICAL HISTORY: Past Medical History:  Diagnosis Date  . Acute back pain with sciatica   . Anxiety   . Depression   . Migraine   . Numbness  face  . Numbness and tingling in hands   . Numbness and tingling of both legs   . Numbness and tingling of right leg   . Ringing in ears   . Urinary incontinence     MEDICATIONS: Current Outpatient Prescriptions on File Prior to Visit  Medication Sig  Dispense Refill  . etonogestrel (NEXPLANON) 68 MG IMPL implant 1 each by Subdermal route once. Reported on 03/10/2016    . meloxicam (MOBIC) 15 MG tablet TK 1 T PO QD  3  . oxyCODONE (OXY IR/ROXICODONE) 5 MG immediate release tablet TK 1 T PO Q 6 H PRN  0  . tiZANidine (ZANAFLEX) 2 MG tablet Take 1-2 tablets (2-4 mg total) by mouth every 6 (six) hours as needed for muscle spasms. 60 tablet 0   No current facility-administered medications on file prior to visit.     ALLERGIES: No Known Allergies  FAMILY HISTORY: Family History  Problem Relation Age of Onset  . Migraines Mother     SOCIAL HISTORY: Social History   Social History  . Marital status: Single    Spouse name: N/A  . Number of children: N/A  . Years of education: N/A   Occupational History  . Not on file.   Social History Main Topics  . Smoking status: Smoker, Current Status Unknown    Packs/day: 1.00    Years: 8.00    Types: Cigarettes    Last attempt to quit: 03/05/2016  . Smokeless tobacco: Never Used     Comment: "really trying to quit"  . Alcohol use No  . Drug use:     Types: Marijuana     Comment: daily  . Sexual activity: Yes    Birth control/ protection: None   Other Topics Concern  . Not on file   Social History Narrative  . No narrative on file    REVIEW OF SYSTEMS: Constitutional: No fevers, chills, or sweats, no generalized fatigue, change in appetite Eyes: No visual changes, double vision, eye pain Ear, nose and throat: No hearing loss, ear pain, nasal congestion, sore throat Cardiovascular: No chest pain, palpitations Respiratory:  No shortness of breath at rest or with exertion, wheezes GastrointestinaI: No nausea, vomiting, diarrhea, abdominal pain, fecal incontinence Genitourinary:  No dysuria, urinary retention or frequency Musculoskeletal:  Neck pain Integumentary: No rash, pruritus, skin lesions Neurological: as above Psychiatric: No depression, insomnia, anxiety Endocrine: No  palpitations, fatigue, diaphoresis, mood swings, change in appetite, change in weight, increased thirst Hematologic/Lymphatic:  No purpura, petechiae. Allergic/Immunologic: no itchy/runny eyes, nasal congestion, recent allergic reactions, rashes  PHYSICAL EXAM: Vitals:   06/16/16 1520  BP: 124/66  Pulse: 68   General: No acute distress.  Patient appears well-groomed.  . Head:  Normocephalic/atraumatic Eyes:  Fundi examined but not visualized Neck: supple, no paraspinal tenderness, full range of motion Heart:  Regular rate and rhythm Lungs:  Clear to auscultation bilaterally Back: No paraspinal tenderness Neurological Exam: alert and oriented to person, place, and time. Attention span and concentration intact, recent and remote memory intact, fund of knowledge intact.  Speech fluent and not dysarthric, language intact.  CN II-XII intact. Bulk and tone normal, muscle strength 5/5 throughout, including APB, interossei and other intrinsic hand muscles.  Sensation to light touch  intact.  Deep tendon reflexes 2+ throughout.  Finger to nose testing intact.  Gait normal  IMPRESSION: Arnold-Chiari malformation status post decompression Right wrist pain.  Does not seem like a nerve entrapment.  Possibly tendonitis.  PLAN:  1.  Continue management as per neurosurgery 2.  Will refer to Dr. Katrinka BlazingSmith of [facility] for evaluation of wrist pain 3.  Follow up in 3 months. 4.  Smoking cessation  15 minutes spent face to face with patient, over 50% spent counseling.  Shon MilletAdam Jaffe, DO  CC:  Wallis BambergMario Mani, PA-C

## 2016-06-16 NOTE — Patient Instructions (Signed)
Follow up with me in 3 months For wrist pain, I will refer you to Dr. Antoine PrimasZachary Smith

## 2016-06-20 ENCOUNTER — Encounter: Payer: Self-pay | Admitting: Neurology

## 2016-10-06 ENCOUNTER — Ambulatory Visit: Payer: BLUE CROSS/BLUE SHIELD | Admitting: Neurology

## 2016-10-24 ENCOUNTER — Ambulatory Visit (INDEPENDENT_AMBULATORY_CARE_PROVIDER_SITE_OTHER): Payer: BLUE CROSS/BLUE SHIELD | Admitting: Neurology

## 2016-10-24 ENCOUNTER — Encounter: Payer: Self-pay | Admitting: Neurology

## 2016-10-24 VITALS — BP 108/62 | HR 82 | Ht 69.0 in | Wt 182.1 lb

## 2016-10-24 DIAGNOSIS — R51 Headache: Secondary | ICD-10-CM

## 2016-10-24 DIAGNOSIS — G935 Compression of brain: Secondary | ICD-10-CM

## 2016-10-24 DIAGNOSIS — R519 Headache, unspecified: Secondary | ICD-10-CM

## 2016-10-24 DIAGNOSIS — Z720 Tobacco use: Secondary | ICD-10-CM | POA: Diagnosis not present

## 2016-10-24 MED ORDER — TOPIRAMATE 50 MG PO TABS: 50.0000 mg | ORAL_TABLET | Freq: Every day | ORAL | 2 refills | Status: DC

## 2016-10-24 NOTE — Progress Notes (Signed)
NEUROLOGY FOLLOW UP OFFICE NOTE  TAYSHA MAJEWSKI [id number]  HISTORY OF PRESENT ILLNESS: Rhonda Key is a 31 year old right-handed female with history of L5-S1 surgery x2 who follows up for Arnold-Chiari malformation.   UPDATE: Headaches are daily but much less intense (usually 5/10 but on occasion reaches a 7/10) and last several hours. Current medication:  Mobic, oxycodone, tizanidine   HISTORY: Onset:  2011 Location:  Top of head, either side, frontal Quality:  Pressure, sharp stabbing.   Intensity:  10/10 Aura:  no Prodrome:  no Associated symptoms:  Sometimes photophobia or phonophobia.  No nausea or visual disturbance such as vision loss, blurred vision, or diplopia. Duration:  From 3 seconds to 30 minutes Frequency:  Several times a day. Triggers/exacerbating factors:  Initially it only occurred with change in position (such as bending over) or exertion (cough, exercise, valsalva).  Then, it would occur spontaneously but these activities still trigger or exacerbate it. Relieving factors:  Laying flat. Activity:  Lays down When the pain is lessened, she notes numbness and tingling involving the lower half of her face bilaterally, as well as lower arms and hands.  She denies neck pain or radicular pain down the arms.  She has some residual numbness and bladder dysfunction from lumbosacral surgery, but nothing new.   Past NSAIDS:  Advil, Aleve Past analgesics:  Marlin Canary Past abortive triptans:  no Past muscle relaxants:  Baclofen Past anti-emetic:  no Past anti-anxiolytic:  no Past sleep aide:  no Past antihypertensive medications:  no Past antidepressant medications:  amitriptyline (was only on a low dose) Past anticonvulsant medications:  no Past vitamins/Herbal/Supplements:  no Past antihistamines/decongestants:  no Other past medications:  no    To evaluate exertional headache with numbness and tingling of lower face and arms, as well as trouble swallowing, an MRI  of brain with and without contrast was performed on 01/11/16, and revealed an Arnold-Chiari malformation with cerebellar tonsils extending 24 mm below the foramen magnum.  MRA of head was negative.  MRI of cervical spine with and without contrast from 01/17/16 demonstrated no cord edema or syrinx.  She was evaluated by Dr. Venetia Maxon at [facility] and underwent Chiari decompression and cervical laminectomy on 03/13/16.    PAST MEDICAL HISTORY: Past Medical History:  Diagnosis Date  . Acute back pain with sciatica   . Anxiety   . Depression   . Migraine   . Numbness    face  . Numbness and tingling in hands   . Numbness and tingling of both legs   . Numbness and tingling of right leg   . Ringing in ears   . Urinary incontinence     MEDICATIONS: Current Outpatient Prescriptions on File Prior to Visit  Medication Sig Dispense Refill  . etonogestrel (NEXPLANON) 68 MG IMPL implant 1 each by Subdermal route once. Reported on 03/10/2016    . meloxicam (MOBIC) 15 MG tablet TK 1 T PO QD  3  . oxyCODONE (OXY IR/ROXICODONE) 5 MG immediate release tablet TK 1 T PO Q 6 H PRN  0  . tiZANidine (ZANAFLEX) 2 MG tablet Take 1-2 tablets (2-4 mg total) by mouth every 6 (six) hours as needed for muscle spasms. 60 tablet 0   No current facility-administered medications on file prior to visit.     ALLERGIES: No Known Allergies  FAMILY HISTORY: Family History  Problem Relation Age of Onset  . Migraines Mother     SOCIAL HISTORY: Social History  Social History  . Marital status: Single    Spouse name: N/A  . Number of children: N/A  . Years of education: N/A   Occupational History  . Not on file.   Social History Main Topics  . Smoking status: Smoker, Current Status Unknown    Packs/day: 1.00    Years: 8.00    Types: Cigarettes    Last attempt to quit: 03/05/2016  . Smokeless tobacco: Never Used     Comment: "really trying to quit"  . Alcohol use No  . Drug use: Yes    Types:  Marijuana     Comment: daily  . Sexual activity: Yes    Birth control/ protection: None   Other Topics Concern  . Not on file   Social History Narrative  . No narrative on file    REVIEW OF SYSTEMS: Constitutional: No fevers, chills, or sweats, no generalized fatigue, change in appetite Eyes: No visual changes, double vision, eye pain Ear, nose and throat: No hearing loss, ear pain, nasal congestion, sore throat Cardiovascular: No chest pain, palpitations Respiratory:  No shortness of breath at rest or with exertion, wheezes GastrointestinaI: No nausea, vomiting, diarrhea, abdominal pain, fecal incontinence Genitourinary:  No dysuria, urinary retention or frequency Musculoskeletal:  No neck pain, back pain Integumentary: No rash, pruritus, skin lesions Neurological: as above Psychiatric: No depression, insomnia, anxiety Endocrine: No palpitations, fatigue, diaphoresis, mood swings, change in appetite, change in weight, increased thirst Hematologic/Lymphatic:  No purpura, petechiae. Allergic/Immunologic: no itchy/runny eyes, nasal congestion, recent allergic reactions, rashes  PHYSICAL EXAM: Vitals:   10/24/16 1401  BP: 108/62  Pulse: 82   General: No acute distress.  Patient appears well-groomed.   Head:  Normocephalic/atraumatic Eyes:  Fundi examined but not visualized Neck: supple, no paraspinal tenderness, full range of motion Heart:  Regular rate and rhythm Lungs:  Clear to auscultation bilaterally Back: No paraspinal tenderness Neurological Exam: alert and oriented to person, place, and time. Attention span and concentration intact, recent and remote memory intact, fund of knowledge intact.  Speech fluent and not dysarthric, language intact.  CN II-XII intact. Bulk and tone normal, muscle strength 5/5 throughout.  Sensation to light touch, temperature and vibration intact.  Deep tendon reflexes 2+ throughout, toes downgoing.  Finger to nose and heel to shin testing  intact.  Gait normal, Romberg negative.  IMPRESSION: Chronic daily headache Arnold Chiari I Malformation status post decompression  PLAN: 1.  Initiate topiramate 50mg  at bedtime.  Side effects discussed.  Advised not to take precautions not to get pregnant while on it.  She will contact us in 4 weeks with update and we can increase dose if needed. 2.  Tizanidine and Mobic as needed 3.  Follow up with Dr. Venetia MaxonStern. 4.  Smoking cessation 5.  Follow up in 3 months.  Shon MilletAdam Kirby Argueta, DO  CC:  Wallis BambergMario Mani, PA-C

## 2016-10-24 NOTE — Patient Instructions (Addendum)
1.  We will start topiramate (Topamax) 50mg  tablet at bedtime.  Possible side effects include: impaired thinking, sedation, paresthesias (numbness and tingling) and weight loss.  It may cause dehydration and there is a small risk for kidney stones, so make sure to stay hydrated with water during the day.  There is also a very small risk for glaucoma, so if you notice any change in your vision while taking this medication, see an ophthalmologist.  Topamax may affect a fetus, so you should take all precautions not to get pregnant while on this medication.  Contact me in 4 weeks with update and we can increase dose if needed.  2.  Follow up in 3 months.

## 2016-11-23 ENCOUNTER — Encounter: Payer: Self-pay | Admitting: Urgent Care

## 2016-11-23 ENCOUNTER — Ambulatory Visit (INDEPENDENT_AMBULATORY_CARE_PROVIDER_SITE_OTHER): Payer: BLUE CROSS/BLUE SHIELD | Admitting: Urgent Care

## 2016-11-23 VITALS — BP 90/60 | HR 66 | Temp 98.1°F | Resp 16 | Ht 69.5 in | Wt 178.8 lb

## 2016-11-23 DIAGNOSIS — M5441 Lumbago with sciatica, right side: Secondary | ICD-10-CM

## 2016-11-23 DIAGNOSIS — G8929 Other chronic pain: Secondary | ICD-10-CM

## 2016-11-23 DIAGNOSIS — Z9889 Other specified postprocedural states: Secondary | ICD-10-CM

## 2016-11-23 MED ORDER — PREDNISONE 20 MG PO TABS: ORAL_TABLET | ORAL | 0 refills | Status: DC

## 2016-11-23 NOTE — Progress Notes (Signed)
  MRN: [id number] DOB: December 08, 1985  Subjective:   Rhonda Key is a 31 y.o. female presenting for chief complaint of Back Pain (LOWER x 3days)  Reports 3 day history of low back pain. Admits that she has had intermittent sciatic nerve pain radiating into her buttock the past 3 weeks. However, her pain worsened with carrying/playing with her daughter this past Monday. Pain is bilateral, low back, like a pressure type sensation. Also feels pinching in her buttocks, radiation of her low back pain into her posterior thighs and knees, numbness and tingling of her feet and toes (which is not new). Has used her heating pad, APAP, percocet. Has a supply of meloxicam and tizanidine which she has not used. Denies fever, weakness, trauma, incontinence, constipation.  Rhonda Key has a current medication list which includes the following prescription(s): etonogestrel, meloxicam, tizanidine, UNABLE TO FIND, and oxycodone. Also has No Known Allergies.  Rhonda Key  has a past medical history of Acute back pain with sciatica; Anxiety; Depression; Migraine; Numbness; Numbness and tingling in hands; Numbness and tingling of both legs; Numbness and tingling of right leg; Ringing in ears; and Urinary incontinence. Also  has a past surgical history that includes Dilation and curettage of uterus; Lumbar laminectomy/decompression microdiscectomy (10/23/2012); Back surgery; Wisdom tooth extraction; Lumbar laminectomy/decompression microdiscectomy (N/A, 02/05/2013); and Suboccipital craniectomy cervical laminectomy (N/A, 03/10/2016).  Objective:   Vitals: BP 90/60 (BP Location: Right Arm, Patient Position: Sitting, Cuff Size: Normal)   Pulse 66   Temp 98.1 F (36.7 C) (Oral)   Resp 16   Ht 5' 9.5" (1.765 m)   Wt 178 lb 12.8 oz (81.1 kg)   LMP 10/26/2016   SpO2 97%   BMI 26.03 kg/m   Physical Exam  Constitutional: She is oriented to person, place, and time. She appears well-developed and well-nourished.  Cardiovascular:  Normal rate.   Pulmonary/Chest: Effort normal.  Musculoskeletal:       Lumbar back: She exhibits decreased range of motion, tenderness and spasm. She exhibits no bony tenderness, no swelling, no edema and no deformity.       Back:  Neurological: She is alert and oriented to person, place, and time. She displays normal reflexes. Coordination normal.  Skin: Skin is warm and dry.   Assessment and Plan :   1. Chronic bilateral low back pain with right-sided sciatica 2. History of back surgery - Will use conservative management. Start meloxicam, tizanidine. Use short steroid course if that does not help. RTC in 1 week if symptoms persist.   Wallis BambergMario Kahleah Crass, PA-C Primary Care at [facility] [phone] 11/23/2016  4:12 PM

## 2016-11-23 NOTE — Patient Instructions (Addendum)
Try meloxicam with tizanidine first for your back pain. If this does not help, then try to use a short steroid course.   Sciatica Sciatica is pain, numbness, weakness, or tingling along the path of the sciatic nerve. The sciatic nerve starts in the lower back and runs down the back of each leg. The nerve controls the muscles in the lower leg and in the back of the knee. It also provides feeling (sensation) to the back of the thigh, the lower leg, and the sole of the foot. Sciatica is a symptom of another medical condition that pinches or puts pressure on the sciatic nerve. Generally, sciatica only affects one side of the body. Sciatica usually goes away on its own or with treatment. In some cases, sciatica may keep coming back (recur). What are the causes? This condition is caused by pressure on the sciatic nerve, or pinching of the sciatic nerve. This may be the result of:  A disk in between the bones of the spine (vertebrae) bulging out too far (herniated disk).  Age-related changes in the spinal disks (degenerative disk disease).  A pain disorder that affects a muscle in the buttock (piriformis syndrome).  Extra bone growth (bone spur) near the sciatic nerve.  An injury or break (fracture) of the pelvis.  Pregnancy.  Tumor (rare). What increases the risk? The following factors may make you more likely to develop this condition:  Playing sports that place pressure or stress on the spine, such as football or weight lifting.  Having poor strength and flexibility.  A history of back injury.  A history of back surgery.  Sitting for long periods of time.  Doing activities that involve repetitive bending or lifting.  Obesity. What are the signs or symptoms? Symptoms can vary from mild to very severe, and they may include:  Any of these problems in the lower back, leg, hip, or buttock:  Mild tingling or dull aches.  Burning sensations.  Sharp pains.  Numbness in the back  of the calf or the sole of the foot.  Leg weakness.  Severe back pain that makes movement difficult. These symptoms may get worse when you cough, sneeze, or laugh, or when you sit or stand for long periods of time. Being overweight may also make symptoms worse. In some cases, symptoms may recur over time. How is this diagnosed? This condition may be diagnosed based on:  Your symptoms.  A physical exam. Your health care provider may ask you to do certain movements to check whether those movements trigger your symptoms.  You may have tests, including:  Blood tests.  X-rays.  MRI.  CT scan. How is this treated? In many cases, this condition improves on its own, without any treatment. However, treatment may include:  Reducing or modifying physical activity during periods of pain.  Exercising and stretching to strengthen your abdomen and improve the flexibility of your spine.  Icing and applying heat to the affected area.  Medicines that help:  To relieve pain and swelling.  To relax your muscles.  Injections of medicines that help to relieve pain, irritation, and inflammation around the sciatic nerve (steroids).  Surgery. Follow these instructions at home: Medicines   Take over-the-counter and prescription medicines only as told by your health care provider.  Do not drive or operate heavy machinery while taking prescription pain medicine. Managing pain   If directed, apply ice to the affected area.  Put ice in a plastic bag.  Place a towel between  your skin and the bag.  Leave the ice on for 20 minutes, 2-3 times a day.  After icing, apply heat to the affected area before you exercise or as often as told by your health care provider. Use the heat source that your health care provider recommends, such as a moist heat pack or a heating pad.  Place a towel between your skin and the heat source.  Leave the heat on for 20-30 minutes.  Remove the heat if your skin  turns bright red. This is especially important if you are unable to feel pain, heat, or cold. You may have a greater risk of getting burned. Activity   Return to your normal activities as told by your health care provider. Ask your health care provider what activities are safe for you.  Avoid activities that make your symptoms worse.  Take brief periods of rest throughout the day. Resting in a lying or standing position is usually better than sitting to rest.  When you rest for longer periods, mix in some mild activity or stretching between periods of rest. This will help to prevent stiffness and pain.  Avoid sitting for long periods of time without moving. Get up and move around at least one time each hour.  Exercise and stretch regularly, as told by your health care provider.  Do not lift anything that is heavier than 10 lb (4.5 kg) while you have symptoms of sciatica. When you do not have symptoms, you should still avoid heavy lifting, especially repetitive heavy lifting.  When you lift objects, always use proper lifting technique, which includes:  Bending your knees.  Keeping the load close to your body.  Avoiding twisting. General instructions   Use good posture.  Avoid leaning forward while sitting.  Avoid hunching over while standing.  Maintain a healthy weight. Excess weight puts extra stress on your back and makes it difficult to maintain good posture.  Wear supportive, comfortable shoes. Avoid wearing high heels.  Avoid sleeping on a mattress that is too soft or too hard. A mattress that is firm enough to support your back when you sleep may help to reduce your pain.  Keep all follow-up visits as told by your health care provider. This is important. Contact a health care provider if:  You have pain that wakes you up when you are sleeping.  You have pain that gets worse when you lie down.  Your pain is worse than you have experienced in the past.  Your pain  lasts longer than 4 weeks.  You experience unexplained weight loss. Get help right away if:  You lose control of your bowel or bladder (incontinence).  You have:  Weakness in your lower back, pelvis, buttocks, or legs that gets worse.  Redness or swelling of your back.  A burning sensation when you urinate. This information is not intended to replace advice given to you by your health care provider. Make sure you discuss any questions you have with your health care provider. Document Released: 08/29/2001 Document Revised: 02/08/2016 Document Reviewed: 05/14/2015 Elsevier Interactive Patient Education  2017 [employer]     IF you received an x-ray today, you will receive an invoice from [facility]. Please contact [facility] at [phone] with questions or concerns regarding your invoice.   IF you received labwork today, you will receive an invoice from [facility]. Please contact [facility] at [phone] with questions or concerns regarding your invoice.   Our billing staff will not be able to  assist you with questions regarding bills from these companies.  You will be contacted with the lab results as soon as they are available. The fastest way to get your results is to activate your My Chart account. Instructions are located on the last page of this paperwork. If you have not heard from [place] regarding the results in 2 weeks, please contact this office.

## 2016-11-24 ENCOUNTER — Telehealth: Payer: Self-pay | Admitting: Urgent Care

## 2016-11-24 NOTE — Telephone Encounter (Signed)
Patient needs forms completed for her disability for her lower back pain, I have completed what I could from the OV notes and highlighted the areas that need to be finished. I will place the forms in Mani's box on 11/24/16 please return them to the FMLA/Disability box at the [employer] checkout desk within 5-7 business days. Thank you!

## 2016-11-28 NOTE — Telephone Encounter (Signed)
Completed forms for the patient missing work 03/05-05/2017. Placed them in FMLA/disability box.

## 2016-12-25 ENCOUNTER — Telehealth: Payer: Self-pay | Admitting: Neurology

## 2016-12-25 MED ORDER — NORTRIPTYLINE HCL 10 MG PO CAPS: 10.0000 mg | ORAL_CAPSULE | Freq: Every day | ORAL | 0 refills | Status: DC

## 2016-12-25 NOTE — Telephone Encounter (Signed)
I would stop topiramate and instead start nortriptyline  at bedtime.

## 2016-12-25 NOTE — Telephone Encounter (Signed)
Caller: patient  Urgent? No   Reason for the call: patient states that she cant take the topamax and would like to talk to someone

## 2016-12-25 NOTE — Telephone Encounter (Signed)
Pt states she has tried the Topamax 50 mg different times of the day, with and without food and and it makes her feel "weird" every time she takes it. States the med causes her "head to be amplified". Please advise.

## 2016-12-25 NOTE — Telephone Encounter (Signed)
Spoke to patient. Gave instructions. Sent Rx to pharmacy verified on file.

## 2017-02-02 ENCOUNTER — Ambulatory Visit (INDEPENDENT_AMBULATORY_CARE_PROVIDER_SITE_OTHER): Payer: BLUE CROSS/BLUE SHIELD | Admitting: Neurology

## 2017-02-02 ENCOUNTER — Encounter: Payer: Self-pay | Admitting: Neurology

## 2017-02-02 VITALS — BP 90/60 | HR 85 | Ht 69.5 in | Wt 171.0 lb

## 2017-02-02 DIAGNOSIS — R51 Headache: Secondary | ICD-10-CM | POA: Diagnosis not present

## 2017-02-02 DIAGNOSIS — G935 Compression of brain: Secondary | ICD-10-CM | POA: Diagnosis not present

## 2017-02-02 DIAGNOSIS — R519 Headache, unspecified: Secondary | ICD-10-CM

## 2017-02-02 MED ORDER — NORTRIPTYLINE HCL 25 MG PO CAPS: 25.0000 mg | ORAL_CAPSULE | Freq: Every day | ORAL | 5 refills | Status: DC

## 2017-02-02 NOTE — Patient Instructions (Signed)
1.  Increase nortriptyline to 25mg  at bedtime (finish current 10mg  tablets by taking 2 tablets at bedtime).  Contact me in 4 to 6 weeks with update and we can increase dose further if needed.  I want to try and get the headaches and nerve pain under better control 2.  Follow up in 6 months

## 2017-02-02 NOTE — Progress Notes (Signed)
NEUROLOGY FOLLOW UP OFFICE NOTE  Terrilee FilesShannon R Tholl [id number]  HISTORY OF PRESENT ILLNESS: Rhonda Key is a 31 year old right-handed female with history of L5-S1 surgery x2 who follows up for headache and Arnold-Chiari malformation.   UPDATE: Headaches are still daily but low intensity.  Topiramate was discontinued due to numbness in the hands.  She was switched to nortriptyline. Intensity:  4/10 Duration:  Approximately an hour Frequency:  daily Frequency of abortive medication: Tylenol 3 days per week Current NSAIDS:  no Current analgesics:  Tylenol Current triptans:  no Current anti-emetic:  no Current muscle relaxants: no Current anti-anxiolytic:  no Current sleep aide:  no Current Antihypertensive medications:  no Current Antidepressant medications:  nortriptyline 10mg  Current Anticonvulsant medications:  no Current Vitamins/Herbal/Supplements:  no Other therapy:  no  Since the second surgery, she continues to have some facial numbness.  She also reports numbness and dysesthesia in the saddle region.  Sometimes it feels like she is urinating on herself when she isn't.  She denies any significant bladder issues.  She reports painful intercourse.  She has numbness in the lateral right leg due to sciatica.  Also, sometimes it feels like her eyes are moving in different directions for a second.   HISTORY: Onset:  2011 Location:  Top of head, either side, frontal Quality:  Pressure, sharp stabbing.   Intensity:  10/10 Aura:  no Prodrome:  no Associated symptoms:  Sometimes photophobia or phonophobia.  No nausea or visual disturbance such as vision loss, blurred vision, or diplopia. Duration:  From 3 seconds to 30 minutes Frequency:  Several times a day. Triggers/exacerbating factors:  Initially it only occurred with change in position (such as bending over) or exertion (cough, exercise, valsalva).  Then, it would occur spontaneously but these activities still trigger or  exacerbate it. Relieving factors:  Laying flat. Activity:  Lays down When the pain is lessened, she notes numbness and tingling involving the lower half of her face bilaterally, as well as lower arms and hands.  She denies neck pain or radicular pain down the arms.  She has some residual numbness and bladder dysfunction from lumbosacral surgery, but nothing new.   Past NSAIDS:  Advil, Aleve Past analgesics:  Marlin CanaryGoody Past abortive triptans:  no Past muscle relaxants:  Baclofen Past anti-emetic:  no Past anti-anxiolytic:  no Past sleep aide:  no Past antihypertensive medications:  no Past antidepressant medications:  amitriptyline (was only on a low dose) Past anticonvulsant medications:  topiramate 50mg  (makes her feel "weird") Past vitamins/Herbal/Supplements:  no Past antihistamines/decongestants:  no Other past medications:  no   To evaluate exertional headache with numbness and tingling of lower face and arms, as well as trouble swallowing, an MRI of brain with and without contrast was performed on 01/11/16, and revealed an Arnold-Chiari malformation with cerebellar tonsils extending 24 mm below the foramen magnum.  MRA of head was negative.  MRI of cervical spine with and without contrast from 01/17/16 demonstrated no cord edema or syrinx.  She was [facility] and underwent Chiari decompression and cervical laminectomy on 03/13/16.    PAST MEDICAL HISTORY: Past Medical History:  Diagnosis Date  . Acute back pain with sciatica   . Anxiety   . Depression   . Migraine   . Numbness    face  . Numbness and tingling in hands   . Numbness and tingling of both legs   . Numbness and tingling of right leg   .  Ringing in ears   . Urinary incontinence     MEDICATIONS: Current Outpatient Prescriptions on File Prior to Visit  Medication Sig Dispense Refill  . etonogestrel (NEXPLANON) 68 MG IMPL implant 1 each by Subdermal route once. Reported on 03/10/2016     . meloxicam (MOBIC) 15 MG tablet TK 1 T PO QD prn  3  . tiZANidine (ZANAFLEX) 2 MG tablet Take 1-2 tablets (2-4 mg total) by mouth every 6 (six) hours as needed for muscle spasms. 60 tablet 0   No current facility-administered medications on file prior to visit.     ALLERGIES: No Known Allergies  FAMILY HISTORY: Family History  Problem Relation Age of Onset  . Migraines Mother     SOCIAL HISTORY: Social History   Social History  . Marital status: Single    Spouse name: N/A  . Number of children: N/A  . Years of education: N/A   Occupational History  . Not on file.   Social History Main Topics  . Smoking status: Smoker, Current Status Unknown    Packs/day: 1.00    Years: 8.00    Types: Cigarettes    Last attempt to quit: 03/05/2016  . Smokeless tobacco: Never Used     Comment: "really trying to quit"  . Alcohol use No  . Drug use: Yes    Types: Marijuana     Comment: daily  . Sexual activity: Yes    Birth control/ protection: None   Other Topics Concern  . Not on file   Social History Narrative  . No narrative on file    REVIEW OF SYSTEMS: Constitutional: No fevers, chills, or sweats, no generalized fatigue, change in appetite Eyes: No visual changes, double vision, eye pain Ear, nose and throat: No hearing loss, ear pain, nasal congestion, sore throat Cardiovascular: No chest pain, palpitations Respiratory:  Upper respiratory congestion, cough GastrointestinaI: No nausea, vomiting, diarrhea, abdominal pain, fecal incontinence Genitourinary:  No dysuria, urinary retention or frequency Musculoskeletal:  Neck pain Integumentary: tiny papular rash on hands (off and on for many years) Neurological: as above Psychiatric: No depression, insomnia, anxiety Endocrine: No palpitations, fatigue, diaphoresis, mood swings, change in appetite, change in weight, increased thirst Hematologic/Lymphatic:  No purpura, petechiae. Allergic/Immunologic: no itchy/runny eyes,  nasal congestion, recent allergic reactions, rashes  PHYSICAL EXAM: Vitals:   02/02/17 1429  BP: 90/60  Pulse: 85   General: No acute distress.  Patient appears well-groomed.  normal body habitus. Head:  Normocephalic/atraumatic Eyes:  Fundi examined but not visualized Neck: supple, paraspinal tenderness, decreased range of motion Heart:  Regular rate and rhythm Lungs:  Clear to auscultation bilaterally Back: No paraspinal tenderness Neurological Exam: alert and oriented to person, place, and time. Attention span and concentration intact, recent and remote memory intact, fund of knowledge intact.  Speech fluent and not dysarthric, language intact.  CN II-XII intact. Bulk and tone normal, muscle strength 5/5 throughout.  Sensation to temperature reduced on dorsum of right foot and lateral right leg, and vibration intact.  Deep tendon reflexes 2+ throughout, toes downgoing.  Finger to nose and heel to shin testing intact.  Gait normal, Romberg negative.  IMPRESSION: Chronic daily headache Arnold-Chiari Malformation.  She still has some residual symptoms since the surgery, including neuralgia/dysesthesia and possibly mild neurogenic bladder  PLAN: 1.  We will increase nortriptyline to 25mg  at bedtime to treat both headache and neuralgia 2.  If she has any worsening bladder symptoms, recommend seeing a urologist. 3.  Follow up in  6 months  Shon Millet, DO  CC:  Wallis Bamberg, PA-C

## 2017-02-21 ENCOUNTER — Telehealth: Payer: Self-pay | Admitting: Neurology

## 2017-02-21 MED ORDER — PREDNISONE 10 MG PO TABS: ORAL_TABLET | ORAL | 0 refills | Status: DC

## 2017-02-21 NOTE — Telephone Encounter (Signed)
Dr Jaffe-  Please advise 

## 2017-02-21 NOTE — Telephone Encounter (Signed)
PT called and said she has had a headache for 2 weeks and not been able to go to work and wants to know what to do

## 2017-02-21 NOTE — Telephone Encounter (Signed)
Called patient with Dr Moises BloodJaffe's recommendations And sent in prednisone to her pharmacy , patient will call back if this doesn't work

## 2017-02-21 NOTE — Telephone Encounter (Signed)
1.  We can increase nortriptyline to 50mg  at bedtime. 2.  To help break current persistent headache, we can prescribe her a prednisone 10mg  tablet taper:  Take 6tabs x1day, then 5tabs x1day, then 4tabs x1day, then 3tabs x1day, then 2tabs x1day, then 1tab x1day, then STOP

## 2017-02-26 ENCOUNTER — Telehealth: Payer: Self-pay | Admitting: Neurology

## 2017-02-26 ENCOUNTER — Ambulatory Visit (INDEPENDENT_AMBULATORY_CARE_PROVIDER_SITE_OTHER): Payer: BLUE CROSS/BLUE SHIELD | Admitting: *Deleted

## 2017-02-26 DIAGNOSIS — I639 Cerebral infarction, unspecified: Secondary | ICD-10-CM

## 2017-02-26 DIAGNOSIS — G43611 Persistent migraine aura with cerebral infarction, intractable, with status migrainosus: Secondary | ICD-10-CM

## 2017-02-26 MED ORDER — KETOROLAC TROMETHAMINE 60 MG/2ML IM SOLN
60.0000 mg | Freq: Once | INTRAMUSCULAR | Status: AC
Start: 2017-02-26 — End: 2017-02-26
  Administered 2017-02-26: 60 mg via INTRAMUSCULAR

## 2017-02-26 NOTE — Telephone Encounter (Signed)
It looks like prednisone was ordered and nortriptyline was increased.  Any other suggestions?

## 2017-02-26 NOTE — Telephone Encounter (Signed)
Patient coming in for toradol injection.

## 2017-02-26 NOTE — Telephone Encounter (Signed)
If she can come in for a Toradol 60mg  shot.  We just increased nortriptyline last week, so we need to give that 3 more weeks before deciding on increasing the dose further.

## 2017-02-26 NOTE — Telephone Encounter (Signed)
Patient states that the medication is not working. She has had a headache for 2 weeks please call

## 2017-02-27 ENCOUNTER — Telehealth: Payer: Self-pay | Admitting: Neurology

## 2017-02-27 NOTE — Telephone Encounter (Signed)
Patient instructed to go to ED

## 2017-02-27 NOTE — Telephone Encounter (Signed)
PT called and said she got a shot yesterday and it only lasted for about 3 hours and her headache is back and needs to know what to do

## 2017-02-27 NOTE — Telephone Encounter (Signed)
Please advise 

## 2017-02-27 NOTE — Telephone Encounter (Signed)
She needs to go to the ED

## 2017-03-02 ENCOUNTER — Ambulatory Visit (INDEPENDENT_AMBULATORY_CARE_PROVIDER_SITE_OTHER): Payer: BLUE CROSS/BLUE SHIELD | Admitting: *Deleted

## 2017-03-02 ENCOUNTER — Telehealth: Payer: Self-pay | Admitting: Neurology

## 2017-03-02 DIAGNOSIS — R519 Headache, unspecified: Secondary | ICD-10-CM

## 2017-03-02 DIAGNOSIS — R51 Headache: Secondary | ICD-10-CM

## 2017-03-02 MED ORDER — KETOROLAC TROMETHAMINE 60 MG/2ML IM SOLN
60.0000 mg | Freq: Once | INTRAMUSCULAR | Status: AC
  Administered 2017-03-02: 60 mg via INTRAMUSCULAR

## 2017-03-02 MED ORDER — DEXTROSE 5 % IV SOLN
10.0000 mg | Freq: Once | INTRAVENOUS | Status: AC
  Administered 2017-03-02: 10 mg via INTRAVENOUS

## 2017-03-02 MED ORDER — DIPHENHYDRAMINE HCL 50 MG/ML IJ SOLN
25.0000 mg | Freq: Once | INTRAMUSCULAR | Status: AC
  Administered 2017-03-02: 25 mg via INTRAMUSCULAR

## 2017-03-02 NOTE — Telephone Encounter (Signed)
She can come in for a Toradol 60mg  shot if she is driving herself.  If she has a driver, she can get the cocktail.  I also want to make sure she is not overusing pain relievers (she should try to limit use to no more than 2 days out of the week).  When she is needing a refill of the nortriptyline, she should contact us to determine if we need to increase the dose.  I would also like her to move up her follow up appointment sooner to re-evaluate.

## 2017-03-02 NOTE — Telephone Encounter (Signed)
Patient made aware of instructions. Appt moved sooner. She has a driver and will come to the office today for headache cocktail. Nurse appt made.

## 2017-03-02 NOTE — Telephone Encounter (Signed)
Spoke with patient. She states she has had ongoing headache for two weeks. She called 02/27/17 and was instructed to be seen in the ER. She was seen at that time and given a headache cocktail. This alleviated symptoms somewhat, but they are back as of last night into this morning where patient calls it a "splitting headache". She is still taking amitriptyline at bedtime. No abortive therapy. Not taking tylenol/pain relievers because they make it worse. She has been taking nausea medication. She is asking to come in for headache cocktail. Please advise.

## 2017-03-02 NOTE — Telephone Encounter (Signed)
PT called and wants to know if she can come in for a headache shot

## 2017-03-06 ENCOUNTER — Telehealth: Payer: Self-pay | Admitting: Neurology

## 2017-03-06 ENCOUNTER — Encounter: Payer: Self-pay | Admitting: *Deleted

## 2017-03-06 NOTE — Telephone Encounter (Signed)
Patient went to work today and needs a note to say it is ok to return to work please call patient at [phone]

## 2017-03-06 NOTE — Telephone Encounter (Signed)
Patient notified letter is ready

## 2017-03-14 ENCOUNTER — Telehealth: Payer: Self-pay | Admitting: Neurology

## 2017-03-14 NOTE — Telephone Encounter (Signed)
Spoke with patient.  She states she was told by her disability company that forms were refused from our office.  No documentation present.  No forms in our office.  Dr. Moises BloodJaffe's assistance has not seen forms this week.  Patient made aware of this. [facility] about this and contact us back if needed.  She was given our fax number.

## 2017-03-14 NOTE — Telephone Encounter (Signed)
PT left a voicemail message regarding her short term disability paperwork and that we were not filling it out and she wanted to talk to some one about that

## 2017-03-20 ENCOUNTER — Telehealth: Payer: Self-pay

## 2017-03-20 ENCOUNTER — Ambulatory Visit (INDEPENDENT_AMBULATORY_CARE_PROVIDER_SITE_OTHER): Payer: BLUE CROSS/BLUE SHIELD | Admitting: Neurology

## 2017-03-20 ENCOUNTER — Encounter: Payer: Self-pay | Admitting: Neurology

## 2017-03-20 VITALS — HR 96 | Ht 69.5 in | Wt 170.0 lb

## 2017-03-20 DIAGNOSIS — R51 Headache: Secondary | ICD-10-CM | POA: Diagnosis not present

## 2017-03-20 DIAGNOSIS — R519 Headache, unspecified: Secondary | ICD-10-CM

## 2017-03-20 DIAGNOSIS — G935 Compression of brain: Secondary | ICD-10-CM

## 2017-03-20 MED ORDER — NORTRIPTYLINE HCL 50 MG PO CAPS: 100.0000 mg | ORAL_CAPSULE | Freq: Every day | ORAL | 1 refills | Status: DC

## 2017-03-20 MED ORDER — ONDANSETRON HCL 4 MG PO TABS: 4.0000 mg | ORAL_TABLET | Freq: Three times a day (TID) | ORAL | 2 refills | Status: AC | PRN

## 2017-03-20 NOTE — Patient Instructions (Addendum)
1.  Increase nortriptyline to 100mg  at bedtime (take two 50mg  capsules at bedtime).  Contact me in 4 weeks.  If not any better, we will switch medication.  Otherwise, if better we can increase dose or continue current dose. 2.  We will check MRI of brain with and without contrast 3.  Use Zofran as directed for nausea 4.  Follow up in 3 months.

## 2017-03-20 NOTE — Telephone Encounter (Signed)
Faxed provider statement and paperwork, office note, and imaging to [facility]

## 2017-03-20 NOTE — Progress Notes (Signed)
NEUROLOGY FOLLOW UP OFFICE NOTE  Rhonda Key [id number]  HISTORY OF PRESENT ILLNESS: Rhonda Key is a 31 year old right-handed female with history of L5-S1 surgery x2 and Arnold-Chiari malformation status post decompression who follows up for worsening headache.   UPDATE: She was switched to nortriptyline.  Headaches have gotten worse.  Prednisone taper and Toradol shots provided only brief transient relief.  She is no longer taking Tylenol as it is ineffective.  She has had to miss work for about 2 weeks last month. Intensity:  4/10 Duration:  constant Frequency:  daily Frequency of abortive medication: almost none. Current NSAIDS:  no Current analgesics:  Tylenol (stopped) Current triptans:  no Current anti-emetic:  no Current muscle relaxants: no Current anti-anxiolytic:  no Current sleep aide:  no Current Antihypertensive medications:  no Current Antidepressant medications:  nortriptyline 50mg  Current Anticonvulsant medications:  no Current Vitamins/Herbal/Supplements:  no Other therapy:  No  She reports increased anxiety related to family stressors. She walks daily and hydrates.  Sleep is variable.   She denies neck pain,diplopia, dysphagia or numbness in the arms.  She still has some residual perioral numbness.    HISTORY: Initial Exertional Headache: Onset:  2011 Location:  Top of head, either side, frontal Quality:  Pressure, sharp stabbing.   Intensity:  10/10 Aura:  no Prodrome:  no Associated symptoms:  Sometimes photophobia or phonophobia.  No nausea or visual disturbance such as vision loss, blurred vision, or diplopia. Duration:  From 3 seconds to 30 minutes Frequency:  Several times a day. Triggers/exacerbating factors:  Initially it only occurred with change in position (such as bending over) or exertion (cough, exercise, valsalva).  Then, it would occur spontaneously but these activities still trigger or exacerbate it. Relieving factors:  Laying  flat. Activity:  Lays down When the pain is lessened, she notes numbness and tingling involving the lower half of her face bilaterally, as well as lower arms and hands.  She denies neck pain or radicular pain down the arms.  She has some residual numbness and bladder dysfunction from lumbosacral surgery, but nothing new.   Past NSAIDS:  Advil, Aleve Past analgesics:  Marlin Canary Past abortive triptans:  no Past muscle relaxants:  Baclofen Past anti-emetic:  no Past anti-anxiolytic:  no Past sleep aide:  no Past antihypertensive medications:  no Past antidepressant medications:  amitriptyline (was only on a low dose) Past anticonvulsant medications:  topiramate 50mg  (makes her feel "weird"/causes numbness) Past vitamins/Herbal/Supplements:  no Past antihistamines/decongestants:  no Other past medications:  no   To evaluate exertional headache with numbness and tingling of lower face and arms, as well as trouble swallowing, an MRI of brain with and without contrast was performed on 01/11/16, and revealed an Arnold-Chiari malformation with cerebellar tonsils extending 24 mm below the foramen magnum.  MRA of head was negative.  MRI of cervical spine with and without contrast from 01/17/16 demonstrated no cord edema or syrinx.  She was evaluated by Dr. Venetia Maxon at [facility] and underwent Chiari decompression and cervical laminectomy on 03/13/16.  Since the second surgery, she continues to have some facial numbness.  She also reports numbness and dysesthesia in the saddle region.  Sometimes it feels like she is urinating on herself when she isn't.  She denies any significant bladder issues.  She reports painful intercourse.  She has numbness in the lateral right leg due to sciatica.  Also, sometimes it feels like her eyes are moving in different directions for a  second.  Since surgery, she has had a daily headache.  It is holocephalic/retro-orbital, throbbing/pressure/stabbing pain (usually 3-4/10 but  may increase to 7/10) and constant/daily.  She has associated nausea, photophobia, osmophobia and phonophobia.  Triggers include noise, heat, certain smells.  Laying down in a cool quiet room helps.  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  PAST MEDICAL HISTORY: Past Medical History:  Diagnosis Date  . Acute back pain with sciatica   . Anxiety   . Depression   . Migraine   . Numbness    face  . Numbness and tingling in hands   . Numbness and tingling of both legs   . Numbness and tingling of right leg   . Ringing in ears   . Urinary incontinence     MEDICATIONS: Current Outpatient Prescriptions on File Prior to Visit  Medication Sig Dispense Refill  . etonogestrel (NEXPLANON) 68 MG IMPL implant 1 each by Subdermal route once. Reported on 03/10/2016    . tiZANidine (ZANAFLEX) 2 MG tablet Take 1-2 tablets (2-4 mg total) by mouth every 6 (six) hours as needed for muscle spasms. 60 tablet 0   No current facility-administered medications on file prior to visit.     ALLERGIES: No Known Allergies  FAMILY HISTORY: Family History  Problem Relation Age of Onset  . Migraines Mother     SOCIAL HISTORY: Social History   Social History  . Marital status: Single    Spouse name: N/A  . Number of children: N/A  . Years of education: N/A   Occupational History  . Not on file.   Social History Main Topics  . Smoking status: Smoker, Current Status Unknown    Packs/day: 1.00    Years: 8.00    Types: Cigarettes    Last attempt to quit: 03/05/2016  . Smokeless tobacco: Never Used     Comment: "really trying to quit"  . Alcohol use No  . Drug use: Yes    Types: Marijuana     Comment: daily  . Sexual activity: Yes    Birth control/ protection: None   Other Topics Concern  . Not on file   Social History Narrative  . No narrative on file    REVIEW OF SYSTEMS: Constitutional: No fevers, chills, or sweats, no generalized fatigue,  change in appetite Eyes: No visual changes, double vision, eye pain Ear, nose and throat: No hearing loss, ear pain, nasal congestion, sore throat Cardiovascular: No chest pain, palpitations Respiratory:  No shortness of breath at rest or with exertion, wheezes GastrointestinaI: No nausea, vomiting, diarrhea, abdominal pain, fecal incontinence Genitourinary:  No dysuria, urinary retention or frequency Musculoskeletal:  No neck pain, back pain Integumentary: No rash, pruritus, skin lesions Neurological: as above Psychiatric: No depression, insomnia, anxiety Endocrine: No palpitations, fatigue, diaphoresis, mood swings, change in appetite, change in weight, increased thirst Hematologic/Lymphatic:  No purpura, petechiae. Allergic/Immunologic: no itchy/runny eyes, nasal congestion, recent allergic reactions, rashes  PHYSICAL EXAM: Vitals:   03/20/17 1101  Pulse: 96   General: mild distress.  Patient appears well-groomed.  Head:  Normocephalic/atraumatic Eyes:  Fundi examined but not visualized Neck: supple, no paraspinal tenderness, full range of motion Heart:  Regular rate and rhythm Lungs:  Clear to auscultation bilaterally Back: No paraspinal tenderness Neurological Exam: alert and oriented to person, place, and time. Attention span and concentration intact, recent and remote memory intact, fund of knowledge intact.  Speech fluent  and not dysarthric, language intact.  CN II-XII intact. Bulk and tone normal, muscle strength 5/5 throughout.  Sensation to light touch, temperature and vibration intact.  Deep tendon reflexes 2+ throughout, toes downgoing.  Finger to nose and heel to shin testing intact.  Gait normal, Romberg negative.  IMPRESSION: 1.  Chronic migraine.  Ongoing since surgery but worse over the past month.  Possibly triggered by family-related stressors.  Abortive therapy such as Toradol shots, prednisone taper and headache cocktail, have been ineffective. 2.  History of  Arnold Chiari Malformation status post decompression PLAN: 1.  Increase nortriptyline to 100mg  at bedtime.  If not improved in 4 weeks, would switch preventative to topiramate ER 50mg  at bedtime (immediate release topiramate caused numbness and cognitive changes, which one is less likely to experience with the ER). 2.  Due to worsening intractable headache, we will check MRI of brain with and without contrast. 3.  Zofran 4mg  for nausea. 4.  Once headaches are a little more episodic and MRI is reviewed, we can try a triptan (sumatriptan 100mg ) 5.  Follow up in 3 months or sooner if needed.  Shon Millet, DO  CC: Wallis Bamberg, PA-C

## 2017-03-28 ENCOUNTER — Telehealth: Payer: Self-pay | Admitting: Neurology

## 2017-03-28 NOTE — Telephone Encounter (Signed)
Patient made aware paperwork was faxed to [employer] on 03/20/2017 per Leslie's note.  Paperwork has gone off to scanning and we do not have access to it at this time.  She will discuss with [employer] and let us know if she needs anything further from us.

## 2017-03-28 NOTE — Telephone Encounter (Signed)
Caller: Carollee HerterShannon  Urgent? No   Reason for the call: She called regarding Insurance paper work that was to be returned from Dr. Everlena CooperJaffe by 03/21/17?  Please call. Thanks

## 2017-04-11 ENCOUNTER — Ambulatory Visit
Admission: RE | Admit: 2017-04-11 | Discharge: 2017-04-11 | Disposition: A | Discharge status: Home / Self Care | Payer: BLUE CROSS/BLUE SHIELD | Source: Ambulatory Visit | Attending: Neurology | Admitting: Neurology

## 2017-04-11 DIAGNOSIS — R519 Headache, unspecified: Secondary | ICD-10-CM

## 2017-04-11 DIAGNOSIS — R51 Headache: Principal | ICD-10-CM

## 2017-04-11 DIAGNOSIS — G935 Compression of brain: Secondary | ICD-10-CM

## 2017-04-11 MED ORDER — GADOBENATE DIMEGLUMINE 529 MG/ML IV SOLN
16.0000 mL | Freq: Once | INTRAVENOUS | Status: AC | PRN
  Administered 2017-04-11: 16 mL via INTRAVENOUS

## 2017-04-12 ENCOUNTER — Telehealth: Payer: Self-pay | Admitting: *Deleted

## 2017-04-12 NOTE — Telephone Encounter (Signed)
Patient given results

## 2017-04-12 NOTE — Telephone Encounter (Signed)
-----   Message from Drema DallasAdam R Jaffe, DO sent at 04/12/2017  7:10 AM EDT ----- MRI looks okay.  No recurrence of Debroah Looprnold Chiari or other concerning explanation for increased headaches.

## 2017-05-14 ENCOUNTER — Telehealth: Payer: Self-pay

## 2017-05-14 NOTE — Telephone Encounter (Signed)
Called Pt to advise I have paperwork here that has been signed. It is from the [employer]. Pt said it must be an old document, the original was already sent to the company and denied. She has appealed their decision. She asked that I just have it scaned into her chart in case she needs it going forward.

## 2017-05-29 ENCOUNTER — Telehealth: Payer: Self-pay | Admitting: Neurology

## 2017-05-29 NOTE — Telephone Encounter (Signed)
Pt left a voicemail message for a call back from Dr Moises BloodJaffe's nurse

## 2017-05-30 MED ORDER — TOPIRAMATE ER 50 MG PO CAP24: 50.0000 mg | ORAL_CAPSULE | Freq: Every day | ORAL | 3 refills | Status: DC

## 2017-05-30 MED ORDER — NORTRIPTYLINE HCL 25 MG PO CAPS: ORAL_CAPSULE | ORAL | 0 refills | Status: DC

## 2017-05-30 NOTE — Telephone Encounter (Signed)
Called Pt, she stated nortriptyline is not very effective, Dr Everlena CooperJaffe mentioned an extended release medication she could try if the increased dose of nortriptyline wasn't effective. Per Dr Rosanne SackJaffes notes, Pt can try topiramate ER 50mg  QHS. I asked DR Everlena CooperJaffe about weaning off the nortriptyline, he advsd to take 50mg  x1 wk, then 25mg  x1 wk then d/c, ok to start topiramate now. Advsd Pt of instructions, verified pharmacy

## 2017-06-15 ENCOUNTER — Telehealth: Payer: Self-pay | Admitting: Neurology

## 2017-06-15 NOTE — Telephone Encounter (Signed)
Pt left a message asking for a call back regarding medication, did not say what the medication was

## 2017-06-19 ENCOUNTER — Telehealth: Payer: Self-pay | Admitting: Neurology

## 2017-06-19 NOTE — Telephone Encounter (Signed)
Called Pt, LM on VM, advsd her I am working on PA for her [employer], advsd her can offer her samples until can get PA

## 2017-06-19 NOTE — Telephone Encounter (Signed)
They were calling to follow up on a fax that was faxed over to our office on 06/15/17. Please Advise. Thanks

## 2017-06-21 ENCOUNTER — Encounter: Payer: Self-pay | Admitting: Neurology

## 2017-06-21 ENCOUNTER — Ambulatory Visit (INDEPENDENT_AMBULATORY_CARE_PROVIDER_SITE_OTHER): Payer: BLUE CROSS/BLUE SHIELD | Admitting: Neurology

## 2017-06-21 VITALS — BP 94/62 | HR 73 | Ht 69.0 in | Wt 169.6 lb

## 2017-06-21 DIAGNOSIS — G43719 Chronic migraine without aura, intractable, without status migrainosus: Secondary | ICD-10-CM

## 2017-06-21 DIAGNOSIS — G935 Compression of brain: Secondary | ICD-10-CM | POA: Diagnosis not present

## 2017-06-21 MED ORDER — TIZANIDINE HCL 2 MG PO TABS: 2.0000 mg | ORAL_TABLET | Freq: Four times a day (QID) | ORAL | 0 refills | Status: DC | PRN

## 2017-06-21 MED ORDER — SUMATRIPTAN SUCCINATE 100 MG PO TABS: ORAL_TABLET | ORAL | 2 refills | Status: DC

## 2017-06-21 MED ORDER — TOPIRAMATE ER 50 MG PO CAP24: 50.0000 mg | ORAL_CAPSULE | Freq: Every day | ORAL | 0 refills | Status: DC

## 2017-06-21 NOTE — Progress Notes (Signed)
NEUROLOGY FOLLOW UP OFFICE NOTE  Rhonda Key [id number]  HISTORY OF PRESENT ILLNESS: Rhonda Key is a 31 year old right-handed female with history of L5-S1 surgery x2 and Arnold-Chiari malformation status post decompression who follows up for worsening headache.   UPDATE: Due to worsening and frequent headache, an MRI of the brain with and without contrast was performed on 04/11/17, which was personally reviewed and revealed postsurgical changes with decompression of the Chiari malformation.  She reports difficulty with swallowing and continues to have numbness in the upper extremities. She denies diplopia.  She has residual perioral numbness.  Last week, for two days, she woke up in the morning with bowel incontinence.  She had diarrhea. She reports anxiety although her family related stressors have improved.  They continue to be moderate-severe and constant.   Frequency of abortive medication: almost none. Current NSAIDS:  no Current analgesics:  Tylenol (stopped) Current triptans:  no Current anti-emetic:  Zofran Current muscle relaxants: no Current anti-anxiolytic:  no Current sleep aide:  no Current Antihypertensive medications:  no Current Antidepressant medications:  She was prescribed topiramate ER but it was not yet approved. Current Anticonvulsant medications:  no Current Vitamins/Herbal/Supplements:  no Other therapy:  No   HISTORY: Initial Exertional Headache: Onset:  2011 Location:  Top of head, either side, frontal Quality:  Pressure, sharp stabbing.   Initial Intensity:  10/10; July: 4/10 Aura:  no Prodrome:  no Associated symptoms:  Sometimes photophobia or phonophobia.  No nausea or visual disturbance such as vision loss, blurred vision, or diplopia. Duration:  From 3 seconds to 30 minutes; July: constant Frequency:  Several times a day; July: daily Triggers/exacerbating factors:  Initially it only occurred with change in position (such as bending over)  or exertion (cough, exercise, valsalva).  Then, it would occur spontaneously but these activities still trigger or exacerbate it. Relieving factors:  Laying flat. Activity:  Lays down When the pain is lessened, she notes numbness and tingling involving the lower half of her face bilaterally, as well as lower arms and hands.  She denies neck pain or radicular pain down the arms.  She has some residual numbness and bladder dysfunction from lumbosacral surgery, but nothing new.   Past NSAIDS:  Advil, Aleve Past analgesics:  Marlin Canary Past abortive triptans:  no Past muscle relaxants:  Baclofen Past anti-emetic:  no Past anti-anxiolytic:  no Past sleep aide:  no Past antihypertensive medications:  no Past antidepressant medications:  amitriptyline (was only on a low dose), nortriptyline, venlafaxine Past anticonvulsant medications:  topiramate IR  (makes her feel "weird"/causes numbness), nortriptyline  (ineffective) Past vitamins/Herbal/Supplements:  no Past antihistamines/decongestants:  no Other past medications:  no   To evaluate exertional headache with numbness and tingling of lower face and arms, as well as trouble swallowing, an MRI of brain with and without contrast was performed on 01/11/16, and revealed an Arnold-Chiari malformation with cerebellar tonsils extending 24 mm below the foramen magnum.  MRA of head was negative.  MRI of cervical spine with and without contrast from 01/17/16 demonstrated no cord edema or syrinx.  She was evaluated by Dr. Venetia Maxon at [facility] and underwent Chiari decompression and cervical laminectomy on 03/13/16.  Since the second surgery, she continues to have some facial numbness.  She also reports numbness and dysesthesia in the saddle region.  Sometimes it feels like she is urinating on herself when she isn't.  She denies any significant bladder issues.  She reports painful intercourse.  She  has numbness in the lateral right leg due to sciatica.   Also, sometimes it feels like her eyes are moving in different directions for a second.   Since surgery, she has had a daily headache.  It is holocephalic/retro-orbital, throbbing/pressure/stabbing pain (usually 3-4/10 but may increase to 7/10) and constant/daily.  She has associated nausea, photophobia, osmophobia and phonophobia.  Triggers include noise, heat, certain smells.  Laying down in a cool quiet room helps.   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  PAST MEDICAL HISTORY: Past Medical History:  Diagnosis Date  . Acute back pain with sciatica   . Anxiety   . Depression   . Migraine   . Numbness    face  . Numbness and tingling in hands   . Numbness and tingling of both legs   . Numbness and tingling of right leg   . Ringing in ears   . Urinary incontinence     MEDICATIONS: Current Outpatient Prescriptions on File Prior to Visit  Medication Sig Dispense Refill  . etonogestrel (NEXPLANON) 68 MG IMPL implant 1 each by Subdermal route once. Reported on 03/10/2016    . nortriptyline (PAMELOR) 25 MG capsule Take two capsules by mouth at night for two weeks, then 1 capsule by mouth at night for one week, then discontinue (Patient not taking: Reported on 06/21/2017) 21 capsule 0  . nortriptyline (PAMELOR) 50 MG capsule Take 2 capsules (100 mg total) by mouth at bedtime. (Patient not taking: Reported on 06/21/2017) 60 capsule 1  . ondansetron (ZOFRAN) 4 MG tablet Take 1 tablet (4 mg total) by mouth every 8 (eight) hours as needed for nausea or vomiting. 20 tablet 2  . Topiramate ER (TROKENDI XR) 50 MG CP24 Take 50 mg by mouth at bedtime. (Patient not taking: Reported on 06/21/2017) 30 capsule 3   No current facility-administered medications on file prior to visit.     ALLERGIES: No Known Allergies  FAMILY HISTORY: Family History  Problem Relation Age of Onset  . Migraines Mother     SOCIAL HISTORY: Social History   Social History    . Marital status: Single    Spouse name: N/A  . Number of children: N/A  . Years of education: N/A   Occupational History  . Not on file.   Social History Main Topics  . Smoking status: Smoker, Current Status Unknown    Packs/day: 1.00    Years: 8.00    Types: Cigarettes    Last attempt to quit: 03/05/2016  . Smokeless tobacco: Never Used     Comment: "really trying to quit"  . Alcohol use No  . Drug use: Yes    Types: Marijuana     Comment: daily  . Sexual activity: Yes    Birth control/ protection: None   Other Topics Concern  . Not on file   Social History Narrative  . No narrative on file    REVIEW OF SYSTEMS: Constitutional: No fevers, chills, or sweats, no generalized fatigue, change in appetite Eyes: No visual changes, double vision, eye pain Ear, nose and throat: No hearing loss, ear pain, nasal congestion, sore throat Cardiovascular: No chest pain, palpitations Respiratory:  No shortness of breath at rest or with exertion, wheezes GastrointestinaI: No nausea, vomiting, diarrhea, abdominal pain, fecal incontinence Genitourinary:  No dysuria, urinary retention or frequency Musculoskeletal:  No neck pain, back pain Integumentary: No rash, pruritus, skin lesions Neurological: as above  Psychiatric: No depression, insomnia, anxiety Endocrine: No palpitations, fatigue, diaphoresis, mood swings, change in appetite, change in weight, increased thirst Hematologic/Lymphatic:  No purpura, petechiae. Allergic/Immunologic: no itchy/runny eyes, nasal congestion, recent allergic reactions, rashes  PHYSICAL EXAM: Vitals:   06/21/17 1057  BP: 94/62  Pulse: 73  SpO2: 97%   General: Some distress.  Wearing sunglasses.  Patient appears well-groomed. Head:  Normocephalic/atraumatic Eyes:  Fundi examined but not visualized Neck: supple, no paraspinal tenderness, full range of motion Heart:  Regular rate and rhythm Lungs:  Clear to auscultation bilaterally Back: No  paraspinal tenderness Neurological Exam: alert and oriented to person, place, and time. Attention span and concentration intact, recent and remote memory intact, fund of knowledge intact.  Speech fluent and not dysarthric, language intact.  CN II-XII intact. Bulk and tone normal, muscle strength 5/5 throughout.  Sensation to pinprick in hands reduced.  Sensation to vibration intact.  Deep tendon reflexes 2+ throughout, toes downgoing.  Finger to nose and heel to shin testing intact.  Gait normal, Romberg negative.  IMPRESSION: Chronic daily intractable headache, likely migraine.  No evidence of brainstem/upper cervical cord compression on recent MRI  She also endorses bilateral upper extremity numbness, likely residual from prior Chiari.  She endorses some difficulty swallowing, which may be anxiety-related as there is no brainstem compression on MRI.  Episode of bowel incontinence last week.  No recurrence.  I do not suspect cord or conus involvement.  Likely GI viral illness, as she had diarrhea.  PLAN: 1.  She will start topiramate ER  at bedtime.  If ineffective, would try Depakote. 2. Sumatriptan as needed for acute migraine exacerbations 3.  Follow up in 3 months.  25 minutes spent face to face with patient, over 50% spent discussing management.  Shon Millet, DO  CC: Wallis Bamberg, PA-C

## 2017-06-25 ENCOUNTER — Telehealth: Payer: Self-pay | Admitting: Neurology

## 2017-06-25 NOTE — Telephone Encounter (Signed)
I spoke with Pt, she will come by 06/26/17 and pick up return to work letter between 7:30-8:00a

## 2017-06-25 NOTE — Telephone Encounter (Signed)
Patient is here needing to get a return back to work note. She is feeling better and would like to return back. Please Advise. Thanks

## 2017-06-28 ENCOUNTER — Ambulatory Visit (INDEPENDENT_AMBULATORY_CARE_PROVIDER_SITE_OTHER): Payer: BLUE CROSS/BLUE SHIELD | Admitting: Urgent Care

## 2017-06-28 ENCOUNTER — Encounter: Payer: Self-pay | Admitting: Urgent Care

## 2017-06-28 VITALS — BP 110/70 | HR 96 | Temp 98.5°F | Resp 16 | Ht 69.69 in | Wt 163.0 lb

## 2017-06-28 DIAGNOSIS — R42 Dizziness and giddiness: Secondary | ICD-10-CM | POA: Diagnosis not present

## 2017-06-28 DIAGNOSIS — R519 Headache, unspecified: Secondary | ICD-10-CM

## 2017-06-28 DIAGNOSIS — Q0700 Arnold-Chiari syndrome without spina bifida or hydrocephalus: Secondary | ICD-10-CM | POA: Diagnosis not present

## 2017-06-28 DIAGNOSIS — R319 Hematuria, unspecified: Secondary | ICD-10-CM | POA: Diagnosis not present

## 2017-06-28 DIAGNOSIS — R51 Headache: Secondary | ICD-10-CM

## 2017-06-28 LAB — POCT CBC
GRANULOCYTE PERCENT: 58.6 % (ref 37–80)
HCT, POC: 42.5 % (ref 37.7–47.9)
Hemoglobin: 14.4 g/dL (ref 12.2–16.2)
Lymph, poc: 2.5 (ref 0.6–3.4)
MCH: 29.8 pg (ref 27–31.2)
MCHC: 33.8 g/dL (ref 31.8–35.4)
MCV: 88.2 fL (ref 80–97)
MID (cbc): 0.4 (ref 0–0.9)
MPV: 7.8 fL (ref 0–99.8)
PLATELET COUNT, POC: 224 10*3/uL (ref 142–424)
POC Granulocyte: 4.2 (ref 2–6.9)
POC LYMPH PERCENT: 35.6 %L (ref 10–50)
POC MID %: 5.8 %M (ref 0–12)
RBC: 4.82 M/uL (ref 4.04–5.48)
RDW, POC: 12.8 %
WBC: 7.1 10*3/uL (ref 4.6–10.2)

## 2017-06-28 LAB — POCT URINALYSIS DIP (MANUAL ENTRY)
Bilirubin, UA: NEGATIVE
Glucose, UA: NEGATIVE mg/dL
Ketones, POC UA: NEGATIVE mg/dL
Nitrite, UA: NEGATIVE
PROTEIN UA: NEGATIVE mg/dL
Spec Grav, UA: 1.025 (ref 1.010–1.025)
UROBILINOGEN UA: 0.2 U/dL
pH, UA: 6.5 (ref 5.0–8.0)

## 2017-06-28 MED ORDER — MECLIZINE HCL 25 MG PO TABS: 25.0000 mg | ORAL_TABLET | Freq: Three times a day (TID) | ORAL | 0 refills | Status: DC | PRN

## 2017-06-28 NOTE — Patient Instructions (Addendum)
Dizziness Dizziness is a common problem. It is a feeling of unsteadiness or light-headedness. You may feel like you are about to faint. Dizziness can lead to injury if you stumble or fall. Anyone can become dizzy, but dizziness is more common in older adults. This condition can be caused by a number of things, including medicines, dehydration, or illness. Follow these instructions at home: Taking these steps may help with your condition: Eating and drinking  Drink enough fluid to keep your urine clear or pale yellow. This helps to keep you from becoming dehydrated. Try to drink more clear fluids, such as water.  Do not drink alcohol.  Limit your caffeine intake if directed by your health care provider.  Limit your salt intake if directed by your health care provider. Activity  Avoid making quick movements. ? Rise slowly from chairs and steady yourself until you feel okay. ? In the morning, first sit up on the side of the bed. When you feel okay, stand slowly while you hold onto something until you know that your balance is fine.  Move your legs often if you need to stand in one place for a long time. Tighten and relax your muscles in your legs while you are standing.  Do not drive or operate heavy machinery if you feel dizzy.  Avoid bending down if you feel dizzy. Place items in your home so that they are easy for you to reach without leaning over. Lifestyle  Do not use any tobacco products, including cigarettes, chewing tobacco, or electronic cigarettes. If you need help quitting, ask your health care provider.  Try to reduce your stress level, such as with yoga or meditation. Talk with your health care provider if you need help. General instructions  Watch your dizziness for any changes.  Take medicines only as directed by your health care provider. Talk with your health care provider if you think that your dizziness is caused by a medicine that you are taking.  Tell a friend or  a family member that you are feeling dizzy. If he or she notices any changes in your behavior, have this person call your health care provider.  Keep all follow-up visits as directed by your health care provider. This is important. Contact a health care provider if:  Your dizziness does not go away.  Your dizziness or light-headedness gets worse.  You feel nauseous.  You have reduced hearing.  You have new symptoms.  You are unsteady on your feet or you feel like the room is spinning. Get help right away if:  You vomit or have diarrhea and are unable to eat or drink anything.  You have problems talking, walking, swallowing, or using your arms, hands, or legs.  You feel generally weak.  You are not thinking clearly or you have trouble forming sentences. It may take a friend or family member to notice this.  You have chest pain, abdominal pain, shortness of breath, or sweating.  Your vision changes.  You notice any bleeding.  You have a headache.  You have neck pain or a stiff neck.  You have a fever. This information is not intended to replace advice given to you by your health care provider. Make sure you discuss any questions you have with your health care provider. Document Released: 02/28/2001 Document Revised: 02/10/2016 Document Reviewed: 08/31/2014 Elsevier Interactive Patient Education  2017 Elsevier Inc.   Topiramate extended-release capsules What is this medicine? TOPIRAMATE (toe PYRE a mate) is used to treat  seizures in adults or children with epilepsy. It is also used for the prevention of migraine headaches. This medicine may be used for other purposes; ask your health care provider or pharmacist if you have questions. COMMON BRAND NAME(S): Trokendi XR What should I tell my health care provider before I take this medicine? They need to know if you have any of these conditions: -cirrhosis of the liver or liver disease -diarrhea -glaucoma -kidney stones  or kidney disease -lung disease like asthma, obstructive pulmonary disease, emphysema -metabolic acidosis -on a ketogenic diet -scheduled for surgery or a procedure -suicidal thoughts, plans, or attempt; a previous suicide attempt by you or a family member -an unusual or allergic reaction to topiramate, other medicines, foods, dyes, or preservatives -pregnant or trying to get pregnant -breast-feeding How should I use this medicine? Take this medicine by mouth with a glass of water. Follow the directions on the prescription label. Trokendi XR capsules must be swallowed whole. Do not sprinkle on food, break, crush, dissolve, or chew. Qudexy XR capsules may be swallowed whole or opened and sprinkled on a small amount of soft food. This mixture must be swallowed immediately. Do not chew or store mixture for later use. You may take this medicine with meals. Take your medicine at regular intervals. Do not take it more often than directed. Talk to your pediatrician regarding the use of this medicine in children. Special care may be needed. While Trokendi XR may be prescribed for children as young as 6 years and Qudexy XR may be prescribed for children as young as 2 years for selected conditions, precautions do apply. Overdosage: If you think you have taken too much of this medicine contact a poison control center or emergency room at once. NOTE: This medicine is only for you. Do not share this medicine with others. What if I miss a dose? If you miss a dose, take it as soon as you can. If it is almost time for your next dose, take only that dose. Do not take double or extra doses. What may interact with this medicine? Do not take this medicine with any of the following medications: -probenecid This medicine may also interact with the following medications: -acetazolamide -alcohol -amitriptyline -birth control pills -digoxin -hydrochlorothiazide -lithium -medicines for pain, sleep, or muscle  relaxation -metformin -methazolamide -other seizure or epilepsy medicines -pioglitazone -risperidone This list may not describe all possible interactions. Give your health care provider a list of all the medicines, herbs, non-prescription drugs, or dietary supplements you use. Also tell them if you smoke, drink alcohol, or use illegal drugs. Some items may interact with your medicine. What should I watch for while using this medicine? Visit your doctor or health care professional for regular checks on your progress. Do not stop taking this medicine suddenly. This increases the risk of seizures if you are using this medicine to control epilepsy. Wear a medical identification bracelet or chain to say you have epilepsy or seizures, and carry a card that lists all your medicines. This medicine can decrease sweating and increase your body temperature. Watch for signs of deceased sweating or fever, especially in children. Avoid extreme heat, hot baths, and saunas. Be careful about exercising, especially in hot weather. Contact your health care provider right away if you notice a fever or decrease in sweating. You should drink plenty of fluids while taking this medicine. If you have had kidney stones in the past, this will help to reduce your chances of forming kidney stones.  If you have stomach pain, with nausea or vomiting and yellowing of your eyes or skin, call your doctor immediately. You may get drowsy, dizzy, or have blurred vision. Do not drive, use machinery, or do anything that needs mental alertness until you know how this medicine affects you. To reduce dizziness, do not sit or stand up quickly, especially if you are an older patient. Alcohol can increase drowsiness and dizziness. Avoid alcoholic drinks. Do not drink alcohol for 6 hours before or 6 hours after taking Trokendi XR. If you notice blurred vision, eye pain, or other eye problems, seek medical attention at once for an eye exam. The use  of this medicine may increase the chance of suicidal thoughts or actions. Pay special attention to how you are responding while on this medicine. Any worsening of mood, or thoughts of suicide or dying should be reported to your health care professional right away. This medicine may increase the chance of developing metabolic acidosis. If left untreated, this can cause kidney stones, bone disease, or slowed growth in children. Symptoms include breathing fast, fatigue, loss of appetite, irregular heartbeat, or loss of consciousness. Call your doctor immediately if you experience any of these side effects. Also, tell your doctor about any surgery you plan on having while taking this medicine since this may increase your risk for metabolic acidosis. Birth control pills may not work properly while you are taking this medicine. Talk to your doctor about using an extra method of birth control. Women who become pregnant while using this medicine may enroll in the [place] American Antiepileptic Drug Pregnancy Registry by calling [phone]. This registry collects information about the safety of antiepileptic drug use during pregnancy. What side effects may I notice from receiving this medicine? Side effects that you should report to your doctor or health care professional as soon as possible: -allergic reactions like skin rash, itching or hives, swelling of the face, lips, or tongue -decreased sweating and/or rise in body temperature -depression -difficulty breathing, fast or irregular breathing patterns -difficulty speaking -difficulty walking or controlling muscle movements -hearing impairment -redness, blistering, peeling or loosening of the skin, including inside the mouth -tingling, pain or numbness in the hands or feet -unusually weak or tired -worsening of mood, thoughts or actions of suicide or dying Side effects that usually do not require medical attention (report to your doctor or health care  professional if they continue or are bothersome): -altered taste -back pain, joint or muscle aches and pains -diarrhea, or constipation -headache -loss of appetite -nausea -stomach upset, indigestion -tremors This list may not describe all possible side effects. Call your doctor for medical advice about side effects. You may report side effects to FDA at 1-800-FDA-1088. Where should I keep my medicine? Keep out of the reach of children. Store at room temperature between 15 and 30 degrees C (59 and 86 degrees F) in a tightly closed container. Protect from moisture. Throw away any unused medicine after the expiration date. NOTE: This sheet is a summary. It may not cover all possible information. If you have questions about this medicine, talk to your doctor, pharmacist, or health care provider.  2018 Elsevier/Gold Standard (2015-12-24 12:33:11)    Meclizine tablets or capsules What is this medicine? MECLIZINE (MEK li zeen) is an antihistamine. It is used to prevent nausea, vomiting, or dizziness caused by motion sickness. It is also used to prevent and treat vertigo (extreme dizziness or a feeling that you or your surroundings are tilting or spinning  around). This medicine may be used for other purposes; ask your health care provider or pharmacist if you have questions. COMMON BRAND NAME(S): Antivert, Dramamine Less Drowsy, Medivert, Meni-D What should I tell my health care provider before I take this medicine? They need to know if you have any of these conditions: -glaucoma -lung or breathing disease, like asthma -problems urinating -prostate disease -stomach or intestine problems -an unusual or allergic reaction to meclizine, other medicines, foods, dyes, or preservatives -pregnant or trying to get pregnant -breast-feeding How should I use this medicine? Take this medicine by mouth with a glass of water. Follow the directions on the prescription label. If you are using this  medicine to prevent motion sickness, take the dose at least 1 hour before travel. If it upsets your stomach, take it with food or milk. Take your doses at regular intervals. Do not take your medicine more often than directed. Talk to your pediatrician regarding the use of this medicine in children. Special care may be needed. Overdosage: If you think you have taken too much of this medicine contact a poison control center or emergency room at once. NOTE: This medicine is only for you. Do not share this medicine with others. What if I miss a dose? If you miss a dose, take it as soon as you can. If it is almost time for your next dose, take only that dose. Do not take double or extra doses. What may interact with this medicine? Do not take this medicine with any of the following medications: -MAOIs like Carbex, Eldepryl, Marplan, Nardil, and Parnate This medicine may also interact with the following medications: -alcohol -antihistamines for allergy, cough and cold -certain medicines for anxiety or sleep -certain medicines for depression, like amitriptyline, fluoxetine, sertraline -certain medicines for seizures like phenobarbital, primidone -general anesthetics like halothane, isoflurane, methoxyflurane, propofol -local anesthetics like lidocaine, pramoxine, tetracaine -medicines that relax muscles for surgery -narcotic medicines for pain -phenothiazines like chlorpromazine, mesoridazine, prochlorperazine, thioridazine This list may not describe all possible interactions. Give your health care provider a list of all the medicines, herbs, non-prescription drugs, or dietary supplements you use. Also tell them if you smoke, drink alcohol, or use illegal drugs. Some items may interact with your medicine. What should I watch for while using this medicine? Tell your doctor or healthcare professional if your symptoms do not start to get better or if they get worse. You may get drowsy or dizzy. Do not  drive, use machinery, or do anything that needs mental alertness until you know how this medicine affects you. Do not stand or sit up quickly, especially if you are an older patient. This reduces the risk of dizzy or fainting spells. Alcohol may interfere with the effect of this medicine. Avoid alcoholic drinks. Your mouth may get dry. Chewing sugarless gum or sucking hard candy, and drinking plenty of water may help. Contact your doctor if the problem does not go away or is severe. This medicine may cause dry eyes and blurred vision. If you wear contact lenses you may feel some discomfort. Lubricating drops may help. See your eye doctor if the problem does not go away or is severe. What side effects may I notice from receiving this medicine? Side effects that you should report to your doctor or health care professional as soon as possible: -feeling faint or lightheaded, falls -fast, irregular heartbeat Side effects that usually do not require medical attention (report to your doctor or health care professional if they continue  or are bothersome): -constipation -headache -trouble passing urine or change in the amount of urine -trouble sleeping -upset stomach This list may not describe all possible side effects. Call your doctor for medical advice about side effects. You may report side effects to FDA at 1-800-FDA-1088. Where should I keep my medicine? Keep out of the reach of children. Store at room temperature between 15 and 30 degrees C (59 and 86 degrees F). Keep container tightly closed. Throw away any unused medicine after the expiration date. NOTE: This sheet is a summary. It may not cover all possible information. If you have questions about this medicine, talk to your doctor, pharmacist, or health care provider.  2018 Elsevier/Gold Standard (2015-10-06 19:41:02)     IF you received an x-ray today, you will receive an invoice from [facility]. Please contact [facility] at [phone] with questions or concerns regarding your invoice.   IF you received labwork today, you will receive an invoice from [facility]. Please contact [facility] at [phone] with questions or concerns regarding your invoice.   Our billing staff will not be able to assist you with questions regarding bills from these companies.  You will be contacted with the lab results as soon as they are available. The fastest way to get your results is to activate your My Chart account. Instructions are located on the last page of this paperwork. If you have not heard from [place] regarding the results in 2 weeks, please contact this office.

## 2017-06-28 NOTE — Progress Notes (Signed)
MRN: [id number] DOB: [birth year]  Subjective:   Rhonda Key is a 31 y.o. female presenting for dizziness. Has a longstanding history of migraines, headaches. Has past medical history Budd Chiari Syndrome. Rhonda Key has been working with her neurologist on these conditions. Rhonda Key started Topiramate XR  nightly, has taken 6 doses. Has had some improvement with her Budd Chiari symptoms, headaches are improved as well. Rhonda Key has also had intermittent nausea without vomiting. Denies confusion, abdominal pain, dysuria, hematuria, urinary frequency.   Season has a current medication list which includes the following prescription(s): etonogestrel, ondansetron, sumatriptan, tizanidine, topiramate er, and topiramate er. Also has No Known Allergies.  Rhonda Key  has a past medical history of Acute back pain with sciatica; Anxiety; Depression; Migraine; Numbness; Numbness and tingling in hands; Numbness and tingling of both legs; Numbness and tingling of right leg; Ringing in ears; and Urinary incontinence. Also  has a past surgical history that includes Dilation and curettage of uterus; Lumbar laminectomy/decompression microdiscectomy (10/23/2012); Back surgery; Wisdom tooth extraction; Lumbar laminectomy/decompression microdiscectomy (N/A, 02/05/2013); and Suboccipital craniectomy cervical laminectomy (N/A, 03/10/2016).  Objective:   Vitals: BP 110/70   Pulse 96   Temp 98.5 F (36.9 C) (Oral)   Resp 16   Ht 5' 9.69" (1.77 m)   Wt 163 lb (73.9 kg)   SpO2 97%   BMI 23.60 kg/m   Orthostatic VS for the past 24 hrs:  BP- Lying BP- Sitting BP- Standing at 0 minutes  06/28/17 1435 100/60 110/64 100/70   Physical Exam  Constitutional: Rhonda Key is oriented to person, place, and time. Rhonda Key appears well-developed and well-nourished.  Cardiovascular: Normal rate, regular rhythm and intact distal pulses.  Exam reveals no gallop and no friction rub.   No murmur heard. Pulmonary/Chest: No respiratory distress. Rhonda Key has no  wheezes. Rhonda Key has no rales.  Neurological: Rhonda Key is alert and oriented to person, place, and time.    Results for orders placed or performed in visit on 06/28/17 (from the past 24 hour(s))  POCT CBC     Status: None   Collection Time: 06/28/17  2:42 PM  Result Value Ref Range   WBC 7.1 4.6 - 10.2 K/uL   Lymph, poc 2.5 0.6 - 3.4   POC LYMPH PERCENT 35.6 10 - 50 %L   MID (cbc) 0.4 0 - 0.9   POC MID % 5.8 0 - 12 %M   POC Granulocyte 4.2 2 - 6.9   Granulocyte percent 58.6 37 - 80 %G   RBC 4.82 4.04 - 5.48 M/uL   Hemoglobin 14.4 12.2 - 16.2 g/dL   HCT, POC 13.0 86.5 - 47.9 %   MCV 88.2 80 - 97 fL   MCH, POC 29.8 27 - 31.2 pg   MCHC 33.8 31.8 - 35.4 g/dL   RDW, POC 78.4 %   Platelet Count, POC 224 142 - 424 K/uL   MPV 7.8 0 - 99.8 fL  POCT urinalysis dipstick     Status: Abnormal   Collection Time: 06/28/17  2:46 PM  Result Value Ref Range   Color, UA yellow yellow   Clarity, UA clear clear   Glucose, UA negative negative mg/dL   Bilirubin, UA negative negative   Ketones, POC UA negative negative mg/dL   Spec Grav, UA 6.962 9.528 - 1.025   Blood, UA small (A) negative   pH, UA 6.5 5.0 - 8.0   Protein Ur, POC negative negative mg/dL   Urobilinogen, UA 0.2 0.2 or 1.0 E.U./dL  Nitrite, UA Negative Negative   Leukocytes, UA Trace (A) Negative   Assessment and Plan :   Dizziness - Plan: Orthostatic vital signs, POCT CBC, POCT urinalysis dipstick  Arnold-Chiari malformation (HCC)  Headache, unspecified headache type  Patient's vitals and point of care labs reassuring. Recommend aggressive hydration, maintain Topiramate XR, add meclizine. Follow up with neurologist.  Wallis Bamberg, PA-C [facility] [phone] 06/28/2017 2:24 PM

## 2017-07-03 ENCOUNTER — Telehealth: Payer: Self-pay | Admitting: Neurology

## 2017-07-03 NOTE — Telephone Encounter (Signed)
Spoke with Pt, she states 4 days after starting Trokendi, she was feeling much better and wanted to go back to work, had called our office rqstd back to work note for [employer]. 06/26/17. She Picked the note up on Wedn, went back to work, felt  "woozy" all day, on [place] [employer] was unable to work, saw her PCP her gave her meclizine  to take for dizziness/vertigo per Pt. She has been drinking lots of water, not able to void very much, no appetite. Advised her to D/C Trokendi until I could talk with Dr Everlena Cooper. Per Dr Everlena Cooper, Start Zonisamde  , 1 QHS

## 2017-07-03 NOTE — Telephone Encounter (Signed)
Wants to talk to someone about medication

## 2017-07-04 ENCOUNTER — Other Ambulatory Visit: Payer: Self-pay

## 2017-07-04 ENCOUNTER — Telehealth: Payer: Self-pay

## 2017-07-04 MED ORDER — ZONISAMIDE 100 MG PO CAPS: 100.0000 mg | ORAL_CAPSULE | Freq: Every day | ORAL | 3 refills | Status: DC

## 2017-07-04 NOTE — Telephone Encounter (Signed)
Called Pt to advise Zonisamide 100mg   [facility] [place] [address] to be taken 1 QHS

## 2017-07-12 ENCOUNTER — Ambulatory Visit (INDEPENDENT_AMBULATORY_CARE_PROVIDER_SITE_OTHER): Payer: BLUE CROSS/BLUE SHIELD | Admitting: Urgent Care

## 2017-07-12 ENCOUNTER — Encounter: Payer: Self-pay | Admitting: Urgent Care

## 2017-07-12 VITALS — BP 108/66 | HR 89 | Temp 98.7°F | Resp 16 | Ht 69.69 in | Wt 165.6 lb

## 2017-07-12 DIAGNOSIS — Z8669 Personal history of other diseases of the nervous system and sense organs: Secondary | ICD-10-CM

## 2017-07-12 DIAGNOSIS — M25531 Pain in right wrist: Secondary | ICD-10-CM | POA: Diagnosis not present

## 2017-07-12 DIAGNOSIS — Z0000 Encounter for general adult medical examination without abnormal findings: Secondary | ICD-10-CM

## 2017-07-12 DIAGNOSIS — Z114 Encounter for screening for human immunodeficiency virus [HIV]: Secondary | ICD-10-CM | POA: Diagnosis not present

## 2017-07-12 DIAGNOSIS — R300 Dysuria: Secondary | ICD-10-CM | POA: Diagnosis not present

## 2017-07-12 DIAGNOSIS — R29898 Other symptoms and signs involving the musculoskeletal system: Secondary | ICD-10-CM

## 2017-07-12 DIAGNOSIS — Z23 Encounter for immunization: Secondary | ICD-10-CM | POA: Diagnosis not present

## 2017-07-12 LAB — POCT URINALYSIS DIP (MANUAL ENTRY)
Bilirubin, UA: NEGATIVE
GLUCOSE UA: NEGATIVE mg/dL
Ketones, POC UA: NEGATIVE mg/dL
LEUKOCYTES UA: NEGATIVE
NITRITE UA: NEGATIVE
PROTEIN UA: NEGATIVE mg/dL
SPEC GRAV UA: 1.02 (ref 1.010–1.025)
UROBILINOGEN UA: 0.2 U/dL
pH, UA: 7.5 (ref 5.0–8.0)

## 2017-07-12 NOTE — Progress Notes (Signed)
MRN: [id number]  Subjective:   [name] is a 31 y.o. female presenting for annual physical exam. Patient is single, is currently under FMLA for her headaches, migraines, Chiari syndrome. Denies smoking cigarettes or drinking alcohol. Smokes 2 joints of marijuana daily. States that she has quit for a couple of months before and did not notice any particular difference in her symptoms.   Medical care team includes: PCP: Wallis BambergMani, Shoua Ulloa, PA-C Vision: Wears eye glasses, has 2 visits yearly. Monitors her intraocular pressures with her ophthalmologist.  Dental: Dental cleanings yearly. OB/GYN: Dr. Madaline GuthrieEaston, plans on having recheck with him for pap smear and removal of Nexplanon. Last pap smear was 07/2014, was normal. Specialists: Dr. Shon MilletAdam Jaffe, neurology for management of migraines, Chiari malformation.   Rhonda Key has a current medication list which includes the following prescription(s): etonogestrel and ondansetron. She has No Known Allergies. Rhonda Key  has a past medical history of Acute back pain with sciatica; Anxiety; Depression; Migraine; Numbness; Numbness and tingling in hands; Numbness and tingling of both legs; Numbness and tingling of right leg; Ringing in ears; and Urinary incontinence. Also  has a past surgical history that includes Dilation and curettage of uterus; Lumbar laminectomy/decompression microdiscectomy (10/23/2012); Back surgery; Wisdom tooth extraction; Lumbar laminectomy/decompression microdiscectomy (N/A, 02/05/2013); and Suboccipital craniectomy cervical laminectomy (N/A, 03/10/2016). Her  family history includes Migraines in her mother.  Immunizations: Will update tdap today. Patient was recommended to avoid flu shots given her surgeries for Chiari syndrome.   Review of Systems  Constitutional: Positive for malaise/fatigue. Negative for chills, diaphoresis, fever and weight loss.  HENT: Positive for ear pain, hearing loss and tinnitus. Negative for congestion, ear  discharge, nosebleeds and sore throat.   Eyes: Positive for photophobia and pain. Negative for blurred vision, double vision, discharge and redness.  Respiratory: Negative for cough, shortness of breath and wheezing.   Cardiovascular: Negative for chest pain, palpitations and leg swelling.  Gastrointestinal: Positive for diarrhea, nausea and vomiting. Negative for abdominal pain, blood in stool and constipation.  Genitourinary: Positive for dysuria and frequency. Negative for flank pain, hematuria and urgency.       Dyspareunia, pelvic pain.  Musculoskeletal: Positive for back pain, myalgias and neck pain. Negative for joint pain.  Skin: Negative for itching and rash.  Neurological: Positive for dizziness, speech change and headaches. Negative for tingling, seizures, loss of consciousness and weakness.  Endo/Heme/Allergies: Negative for polydipsia.  Psychiatric/Behavioral: Negative for depression, hallucinations, memory loss, substance abuse and suicidal ideas. The patient is not nervous/anxious and does not have insomnia.    Objective:   Vitals: BP 108/66 (BP Location: Right Arm, Patient Position: Sitting, Cuff Size: Normal)   Pulse 89   Temp 98.7 F (37.1 C) (Oral)   Resp 16   Ht 5' 9.69" (1.77 m)   Wt 165 lb 9.6 oz (75.1 kg)   SpO2 97%   BMI 23.97 kg/m   BP Readings from Last 3 Encounters:  07/12/17 108/66  06/28/17 110/70  06/21/17 94/62   Physical Exam  Constitutional: She is oriented to person, place, and time. She appears well-developed and well-nourished.  HENT:  TM's intact bilaterally, no effusions or erythema. Nasal turbinates pink and moist, nasal passages patent. No sinus tenderness. Oropharynx clear, mucous membranes moist, dentition in good repair.  Eyes: Pupils are equal, round, and reactive to light. Conjunctivae and EOM are normal. Right eye exhibits no discharge. Left eye exhibits no discharge. No scleral icterus.  Neck: Normal range of motion. Neck supple.  No thyromegaly present.  Cardiovascular: Normal rate, regular rhythm and intact distal pulses.  Exam reveals no gallop and no friction rub.   No murmur heard. Pulmonary/Chest: No respiratory distress. She has no wheezes. She has no rales.  Abdominal: Soft. Bowel sounds are normal. She exhibits no distension and no mass. There is no tenderness. There is no guarding.  Musculoskeletal: Normal range of motion. She exhibits no edema or tenderness.  Lymphadenopathy:    She has no cervical adenopathy.  Neurological: She is alert and oriented to person, place, and time. She has normal reflexes. She displays normal reflexes. Coordination normal.  Skin: Skin is warm and dry. No rash noted. No erythema. No pallor.  Psychiatric: She has a normal mood and affect.   Results for orders placed or performed in visit on 07/12/17 (from the past 24 hour(s))  POCT urinalysis dipstick     Status: Abnormal   Collection Time: 07/12/17  3:07 PM  Result Value Ref Range   Color, UA yellow yellow   Clarity, UA clear clear   Glucose, UA negative negative mg/dL   Bilirubin, UA negative negative   Ketones, POC UA negative negative mg/dL   Spec Grav, UA 1.610 9.604 - 1.025   Blood, UA trace-lysed (A) negative   pH, UA 7.5 5.0 - 8.0   Protein Ur, POC negative negative mg/dL   Urobilinogen, UA 0.2 0.2 or 1.0 E.U./dL   Nitrite, UA Negative Negative   Leukocytes, UA Negative Negative     Assessment and Plan :   1. Annual physical exam - Labs pending, patient is medically stable. Discussed healthy lifestyle, diet, exercise, preventative care, vaccinations, and addressed patient's concerns.  - Basic metabolic panel; Future - CBC with Differential/Platelet - Lipid panel - TSH  2. Screening for HIV (human immunodeficiency virus) - HIV antibody  3. Need for Tdap vaccination - Tdap vaccine greater than or equal to 7yo IM  4. Right wrist pain 5. Wrist weakness 6. History of carpal tunnel syndrome - Will refer  to ortho for a consult - Ambulatory referral to Orthopedic Surgery  7. Dysuria - Urinalysis reassuring, urine culture pending. Recommended better hydration.  Patient had an extensive list of medical concerns that she wants to start addressing. She is agreeable to setting up a follow up visit to start working on some of these after we see basic lab results. Regarding her gynecologic symptoms, patient plans on rechecking with her gynecologist. I did recommend patient stop using marijuana. If all labs are non-contributory, we may need to consider medical therapy for anxiety, depression. Will f/u in 2 weeks.  Wallis Bamberg, PA-C [facility] [phone] 07/12/2017  2:16 PM

## 2017-07-12 NOTE — Patient Instructions (Addendum)
Health Maintenance, Female Adopting a healthy lifestyle and getting preventive care can go a long way to promote health and wellness. Talk with your health care provider about what schedule of regular examinations is right for you. This is a good chance for you to check in with your provider about disease prevention and staying healthy. In between checkups, there are plenty of things you can do on your own. Experts have done a lot of research about which lifestyle changes and preventive measures are most likely to keep you healthy. Ask your health care provider for more information. Weight and diet Eat a healthy diet  Be sure to include plenty of vegetables, fruits, low-fat dairy products, and lean protein.  Do not eat a lot of foods high in solid fats, added sugars, or salt.  Get regular exercise. This is one of the most important things you can do for your health. ? Most adults should exercise for at least 150 minutes each week. The exercise should increase your heart rate and make you sweat (moderate-intensity exercise). ? Most adults should also do strengthening exercises at least twice a week. This is in addition to the moderate-intensity exercise.  Maintain a healthy weight  Body mass index (BMI) is a measurement that can be used to identify possible weight problems. It estimates body fat based on height and weight. Your health care provider can help determine your BMI and help you achieve or maintain a healthy weight.  For females 20 years of age and older: ? A BMI below 18.5 is considered underweight. ? A BMI of 18.5 to 24.9 is normal. ? A BMI of 25 to 29.9 is considered overweight. ? A BMI of 30 and above is considered obese.  Watch levels of cholesterol and blood lipids  You should start having your blood tested for lipids and cholesterol at 33 years of age, then have this test every 5 years.  You may need to have your cholesterol levels checked more often if: ? Your lipid or  cholesterol levels are high. ? You are older than 33 years of age. ? You are at high risk for heart disease.  Cancer screening Lung Cancer  Lung cancer screening is recommended for adults 55-80 years old who are at high risk for lung cancer because of a history of smoking.  A yearly low-dose CT scan of the lungs is recommended for people who: ? Currently smoke. ? Have quit within the past 15 years. ? Have at least a 30-pack-year history of smoking. A pack year is smoking an average of one pack of cigarettes a day for 1 year.  Yearly screening should continue until it has been 15 years since you quit.  Yearly screening should stop if you develop a health problem that would prevent you from having lung cancer treatment.  Breast Cancer  Practice breast self-awareness. This means understanding how your breasts normally appear and feel.  It also means doing regular breast self-exams. Let your health care provider know about any changes, no matter how small.  If you are in your 20s or 30s, you should have a clinical breast exam (CBE) by a health care provider every 1-3 years as part of a regular health exam.  If you are 40 or older, have a CBE every year. Also consider having a breast X-ray (mammogram) every year.  If you have a family history of breast cancer, talk to your health care provider about genetic screening.  If you are at high risk   for breast cancer, talk to your health care provider about having an MRI and a mammogram every year.  Breast cancer gene (BRCA) assessment is recommended for women who have family members with BRCA-related cancers. BRCA-related cancers include: ? Breast. ? Ovarian. ? Tubal. ? Peritoneal cancers.  Results of the assessment will determine the need for genetic counseling and BRCA1 and BRCA2 testing.  Cervical Cancer Your health care provider may recommend that you be screened regularly for cancer of the pelvic organs (ovaries, uterus, and  vagina). This screening involves a pelvic examination, including checking for microscopic changes to the surface of your cervix (Pap test). You may be encouraged to have this screening done every 3 years, beginning at age 22.  For women ages 56-65, health care providers may recommend pelvic exams and Pap testing every 3 years, or they may recommend the Pap and pelvic exam, combined with testing for human papilloma virus (HPV), every 5 years. Some types of HPV increase your risk of cervical cancer. Testing for HPV may also be done on women of any age with unclear Pap test results.  Other health care providers may not recommend any screening for nonpregnant women who are considered low risk for pelvic cancer and who do not have symptoms. Ask your health care provider if a screening pelvic exam is right for you.  If you have had past treatment for cervical cancer or a condition that could lead to cancer, you need Pap tests and screening for cancer for at least 20 years after your treatment. If Pap tests have been discontinued, your risk factors (such as having a new sexual partner) need to be reassessed to determine if screening should resume. Some women have medical problems that increase the chance of getting cervical cancer. In these cases, your health care provider may recommend more frequent screening and Pap tests.  Colorectal Cancer  This type of cancer can be detected and often prevented.  Routine colorectal cancer screening usually begins at 33 years of age and continues through 33 years of age.  Your health care provider may recommend screening at an earlier age if you have risk factors for colon cancer.  Your health care provider may also recommend using home test kits to check for hidden blood in the stool.  A small camera at the end of a tube can be used to examine your colon directly (sigmoidoscopy or colonoscopy). This is done to check for the earliest forms of colorectal  cancer.  Routine screening usually begins at age 33.  Direct examination of the colon should be repeated every 5-10 years through 33 years of age. However, you may need to be screened more often if early forms of precancerous polyps or small growths are found.  Skin Cancer  Check your skin from head to toe regularly.  Tell your health care provider about any new moles or changes in moles, especially if there is a change in a mole's shape or color.  Also tell your health care provider if you have a mole that is larger than the size of a pencil eraser.  Always use sunscreen. Apply sunscreen liberally and repeatedly throughout the day.  Protect yourself by wearing long sleeves, pants, a wide-brimmed hat, and sunglasses whenever you are outside.  Heart disease, diabetes, and high blood pressure  High blood pressure causes heart disease and increases the risk of stroke. High blood pressure is more likely to develop in: ? People who have blood pressure in the high end of  the normal range (130-139/85-89 mm Hg). ? People who are overweight or obese. ? People who are African American.  If you are 21-29 years of age, have your blood pressure checked every 3-5 years. If you are 3 years of age or older, have your blood pressure checked every year. You should have your blood pressure measured twice-once when you are at a hospital or clinic, and once when you are not at a hospital or clinic. Record the average of the two measurements. To check your blood pressure when you are not at a hospital or clinic, you can use: ? An automated blood pressure machine at a pharmacy. ? A home blood pressure monitor.  If you are between 17 years and 37 years old, ask your health care provider if you should take aspirin to prevent strokes.  Have regular diabetes screenings. This involves taking a blood sample to check your fasting blood sugar level. ? If you are at a normal weight and have a low risk for diabetes,  have this test once every three years after 33 years of age. ? If you are overweight and have a high risk for diabetes, consider being tested at a younger age or more often. Preventing infection Hepatitis B  If you have a higher risk for hepatitis B, you should be screened for this virus. You are considered at high risk for hepatitis B if: ? You were born in a country where hepatitis B is common. Ask your health care provider which countries are considered high risk. ? Your parents were born in a high-risk country, and you have not been immunized against hepatitis B (hepatitis B vaccine). ? You have HIV or AIDS. ? You use needles to inject street drugs. ? You live with someone who has hepatitis B. ? You have had sex with someone who has hepatitis B. ? You get hemodialysis treatment. ? You take certain medicines for conditions, including cancer, organ transplantation, and autoimmune conditions.  Hepatitis C  Blood testing is recommended for: ? Everyone born from [birth year] through 1967. ? Anyone with known risk factors for hepatitis C.  Sexually transmitted infections (STIs)  You should be screened for sexually transmitted infections (STIs) including gonorrhea and chlamydia if: ? You are sexually active and are younger than 33 years of age. ? You are older than 33 years of age and your health care provider tells you that you are at risk for this type of infection. ? Your sexual activity has changed since you were last screened and you are at an increased risk for chlamydia or gonorrhea. Ask your health care provider if you are at risk.  If you do not have HIV, but are at risk, it may be recommended that you take a prescription medicine daily to prevent HIV infection. This is called pre-exposure prophylaxis (PrEP). You are considered at risk if: ? You are sexually active and do not regularly use condoms or know the HIV status of your partner(s). ? You take drugs by injection. ? You are  sexually active with a partner who has HIV.  Talk with your health care provider about whether you are at high risk of being infected with HIV. If you choose to begin PrEP, you should first be tested for HIV. You should then be tested every 3 months for as long as you are taking PrEP. Pregnancy  If you are premenopausal and you may become pregnant, ask your health care provider about preconception counseling.  If you may become  pregnant, take 400 to 800 micrograms (mcg) of folic acid every day.  If you want to prevent pregnancy, talk to your health care provider about birth control (contraception). Osteoporosis and menopause  Osteoporosis is a disease in which the bones lose minerals and strength with aging. This can result in serious bone fractures. Your risk for osteoporosis can be identified using a bone density scan.  If you are 65 years of age or older, or if you are at risk for osteoporosis and fractures, ask your health care provider if you should be screened.  Ask your health care provider whether you should take a calcium or vitamin D supplement to lower your risk for osteoporosis.  Menopause may have certain physical symptoms and risks.  Hormone replacement therapy may reduce some of these symptoms and risks. Talk to your health care provider about whether hormone replacement therapy is right for you. Follow these instructions at home:  Schedule regular health, dental, and eye exams.  Stay current with your immunizations.  Do not use any tobacco products including cigarettes, chewing tobacco, or electronic cigarettes.  If you are pregnant, do not drink alcohol.  If you are breastfeeding, limit how much and how often you drink alcohol.  Limit alcohol intake to no more than 1 drink per day for nonpregnant women. One drink equals 12 ounces of beer, 5 ounces of wine, or 1 ounces of hard liquor.  Do not use street drugs.  Do not share needles.  Ask your health care  provider for help if you need support or information about quitting drugs.  Tell your health care provider if you often feel depressed.  Tell your health care provider if you have ever been abused or do not feel safe at home. This information is not intended to replace advice given to you by your health care provider. Make sure you discuss any questions you have with your health care provider. Document Released: 03/20/2011 Document Revised: 02/10/2016 Document Reviewed: 06/08/2015 Elsevier Interactive Patient Education  2018 [employer]     IF you received an x-ray today, you will receive an invoice from [facility]. Please contact [facility] at [phone] with questions or concerns regarding your invoice.   IF you received labwork today, you will receive an invoice from [facility]. Please contact [facility] at [phone] with questions or concerns regarding your invoice.   Our billing staff will not be able to assist you with questions regarding bills from these companies.  You will be contacted with the lab results as soon as they are available. The fastest way to get your results is to activate your My Chart account. Instructions are located on the last page of this paperwork. If you have not heard from us regarding the results in 2 weeks, please contact this office.      

## 2017-07-13 LAB — CBC WITH DIFFERENTIAL/PLATELET
BASOS: 0 %
Basophils Absolute: 0 10*3/uL (ref 0.0–0.2)
EOS (ABSOLUTE): 0.1 10*3/uL (ref 0.0–0.4)
EOS: 1 %
HEMOGLOBIN: 15.1 g/dL (ref 11.1–15.9)
Hematocrit: 44.2 % (ref 34.0–46.6)
IMMATURE GRANS (ABS): 0 10*3/uL (ref 0.0–0.1)
IMMATURE GRANULOCYTES: 0 %
Lymphocytes Absolute: 2.6 10*3/uL (ref 0.7–3.1)
Lymphs: 29 %
MCH: 29.9 pg (ref 26.6–33.0)
MCHC: 34.2 g/dL (ref 31.5–35.7)
MCV: 88 fL (ref 79–97)
MONOCYTES: 7 %
MONOS ABS: 0.6 10*3/uL (ref 0.1–0.9)
NEUTROS PCT: 63 %
Neutrophils Absolute: 5.5 10*3/uL (ref 1.4–7.0)
Platelets: 243 10*3/uL (ref 150–379)
RBC: 5.05 x10E6/uL (ref 3.77–5.28)
RDW: 13.5 % (ref 12.3–15.4)
WBC: 8.8 10*3/uL (ref 3.4–10.8)

## 2017-07-13 LAB — LIPID PANEL
Chol/HDL Ratio: 4.9 ratio — ABNORMAL HIGH (ref 0.0–4.4)
Cholesterol, Total: 168 mg/dL (ref 100–199)
HDL: 34 mg/dL — AB (ref >39)
LDL Calculated: 110 mg/dL — ABNORMAL HIGH (ref 0–99)
TRIGLYCERIDES: 122 mg/dL (ref 0–149)
VLDL Cholesterol Cal: 24 mg/dL (ref 5–40)

## 2017-07-13 LAB — HIV ANTIBODY (ROUTINE TESTING W REFLEX): HIV Screen 4th Generation wRfx: NONREACTIVE

## 2017-07-13 LAB — TSH: TSH: 0.773 u[IU]/mL (ref 0.450–4.500)

## 2017-07-14 LAB — URINE CULTURE: ORGANISM ID, BACTERIA: NO GROWTH

## 2017-07-17 ENCOUNTER — Encounter (INDEPENDENT_AMBULATORY_CARE_PROVIDER_SITE_OTHER): Payer: Self-pay | Admitting: Orthopaedic Surgery

## 2017-07-17 ENCOUNTER — Telehealth: Payer: Self-pay | Admitting: Urgent Care

## 2017-07-17 ENCOUNTER — Ambulatory Visit (INDEPENDENT_AMBULATORY_CARE_PROVIDER_SITE_OTHER): Payer: BLUE CROSS/BLUE SHIELD | Admitting: Orthopaedic Surgery

## 2017-07-17 DIAGNOSIS — G5601 Carpal tunnel syndrome, right upper limb: Secondary | ICD-10-CM | POA: Diagnosis not present

## 2017-07-17 DIAGNOSIS — G5602 Carpal tunnel syndrome, left upper limb: Secondary | ICD-10-CM | POA: Diagnosis not present

## 2017-07-17 NOTE — Telephone Encounter (Signed)
Patient had FMLA forms faxed over from the [employer] so that we could grant her FMLA from 06/28/17 through 12/26/17. I called the [employer] to let them know that we have not taken the patient out of work for anything since 11/2016. I also let them know that they needed to send Dr Everlena CooperJaffe the forms because he is the one in charge of her FMLA leave.

## 2017-07-17 NOTE — Progress Notes (Signed)
Office Visit Note   Patient: Rhonda Key           Date of Birth: 05-26-1986           MRN: [id number] Visit Date: 07/17/2017              Requested by: Wallis BambergMani, Mario, PA-C [address] [place], [place] [address] PCP: Wallis BambergMani, Mario, PA-C   Assessment & Plan: Visit Diagnoses:  1. Right carpal tunnel syndrome   2. Left carpal tunnel syndrome     Plan: Impression is carpal tunnel syndrome.  Bilateral wrist splints were provided today.  Will obtain EMGs.  Follow-up after the studies.  Follow-Up Instructions: Return if symptoms worsen or fail to improve.   Orders:  Orders Placed This Encounter  Procedures  . Ambulatory referral to Physical Medicine Rehab   No orders of the defined types were placed in this encounter.     Procedures: No procedures performed   Clinical Data: No additional findings.   Subjective: Chief Complaint  Patient presents with  . Left Hand - Pain  . Right Hand - Pain    Patient is a pleasant 31 year old female who comes in with bilateral hand and wrist pain and numbness and tingling in her fingers.  She types a lot for work and this is been going on for a year and a half.  Right hand is worse.  It wakes her up at night.  She endorses burning pain.  It will radiate up into her shoulder.  She has had a Chiari decompression about a year and half ago.    Review of Systems  Constitutional: Negative.   HENT: Negative.   Eyes: Negative.   Respiratory: Negative.   Cardiovascular: Negative.   Endocrine: Negative.   Musculoskeletal: Negative.   Neurological: Negative.   Hematological: Negative.   Psychiatric/Behavioral: Negative.   All other systems reviewed and are negative.    Objective: Vital Signs: There were no vitals taken for this visit.  Physical Exam  Constitutional: She is oriented to person, place, and time. She appears well-developed and well-nourished.  HENT:  Head: Normocephalic and atraumatic.  Eyes: EOM are normal.  Neck:  Neck supple.  Pulmonary/Chest: Effort normal.  Abdominal: Soft.  Neurological: She is alert and oriented to person, place, and time.  Skin: Skin is warm. Capillary refill takes less than 2 seconds.  Psychiatric: She has a normal mood and affect. Her behavior is normal. Judgment and thought content normal.  Nursing note and vitals reviewed.   Ortho Exam Bilateral hand exam shows positive Durkin's and Phalen's.  Negative Tinel's.  APB muscle function is intact.  Mild thenar flattening. Specialty Comments:  No specialty comments available.  Imaging: No results found.   PMFS History: Patient Active Problem List   Diagnosis Date Noted  . 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 Contraception 01/24/2016  . Arnold-Chiari malformation (HCC) 01/24/2016  . Cephalalgia 01/13/2016   Past Medical History:  Diagnosis Date  . Acute back pain with sciatica   . Anxiety   . Depression   . Migraine   . Numbness    face  . Numbness and tingling in hands   . Numbness and tingling of both legs   . Numbness and tingling of right leg   . Ringing in ears   . Urinary incontinence     Family History  Problem Relation Age of Onset  . Migraines Mother     Past Surgical History:  Procedure Laterality Date  .  BACK SURGERY    . DILATION AND CURETTAGE OF UTERUS    . LUMBAR LAMINECTOMY/DECOMPRESSION MICRODISCECTOMY  10/23/2012   Procedure: LUMBAR LAMINECTOMY/DECOMPRESSION MICRODISCECTOMY 1 LEVEL;  Surgeon: Tia Alert, MD;  Location: [facility];  Service: Neurosurgery;  Laterality: Right;  Right Lumbar five-Sacral one microdiscectomy  . LUMBAR LAMINECTOMY/DECOMPRESSION MICRODISCECTOMY N/A 02/05/2013   Procedure: REDO LUMBAR FIVE SACRAL ONE MICRODISKECTOMY, RIGHT;  Surgeon: Tia Alert, MD;  Location: [facility];  Service: Neurosurgery;  Laterality: N/A;  LUMBAR LAMINECTOMY/DECOMPRESSION MICRODISCECTOMY 1 LEVEL  . SUBOCCIPITAL CRANIECTOMY CERVICAL LAMINECTOMY N/A 03/10/2016   Procedure: Chiari  Decompression with cervical laminectomy;  Surgeon: Maeola Harman, MD;  Location: [facility];  Service: Neurosurgery;  Laterality: N/A;  . WISDOM TOOTH EXTRACTION     Social History   Occupational History  . Not on file.   Social History Main Topics  . Smoking status: Smoker, Current Status Unknown    Packs/day: 1.00    Years: 8.00    Types: Cigarettes    Last attempt to quit: 03/05/2016  . Smokeless tobacco: Never Used     Comment: "really trying to quit"  . Alcohol use No  . Drug use: Yes    Types: Marijuana     Comment: daily  . Sexual activity: Yes    Birth control/ protection: None

## 2017-07-20 ENCOUNTER — Encounter: Payer: Self-pay | Admitting: Family Medicine

## 2017-07-20 ENCOUNTER — Ambulatory Visit (INDEPENDENT_AMBULATORY_CARE_PROVIDER_SITE_OTHER): Payer: BLUE CROSS/BLUE SHIELD | Admitting: Family Medicine

## 2017-07-20 VITALS — BP 98/64 | HR 106 | Temp 99.2°F | Resp 18 | Ht 69.69 in | Wt 165.4 lb

## 2017-07-20 DIAGNOSIS — G43001 Migraine without aura, not intractable, with status migrainosus: Secondary | ICD-10-CM

## 2017-07-20 MED ORDER — KETOROLAC TROMETHAMINE 60 MG/2ML IM SOLN
60.0000 mg | Freq: Once | INTRAMUSCULAR | Status: AC
  Administered 2017-07-20: 60 mg via INTRAMUSCULAR

## 2017-07-20 MED ORDER — PROMETHAZINE HCL 25 MG/ML IJ SOLN
25.0000 mg | Freq: Once | INTRAMUSCULAR | Status: AC
  Administered 2017-07-20: 25 mg via INTRAMUSCULAR

## 2017-07-20 NOTE — Progress Notes (Signed)
11/2/20183:46 PM  Rhonda Key 21-May-1986, 31 y.o. female [id number]  Chief Complaint  Patient presents with  . Headache    x week     HPI:   Patient is a 31 y.o. female with past medical history significant for chiari malformation s/p decompression and chronic headahces who presents today for migraine. Patient has been having worsening of chronic headaches for past week, but today developed migraine with associated photophobia, phonophobia, nausea. Denies aura. Seen by neurology. Has tried TCA, topomax, triptans in the past. Nothing completely resolves headaches. Tylenol helps some chronic headaches. Toradol and lying in dark quiet room helps with migraines. Would like an injection today.  Depression screen [facility] 2/9 07/20/2017 07/12/2017 06/28/2017  Decreased Interest 0 0 0  Down, Depressed, Hopeless 0 0 0  PHQ - 2 Score 0 0 0    No Known Allergies  Prior to Admission medications   Medication Sig Start Date End Date Taking? Authorizing Provider  etonogestrel (NEXPLANON) 68 MG IMPL implant 1 each by Subdermal route once. Reported on 03/10/2016   Yes [provider]  ondansetron (ZOFRAN) 4 MG tablet Take 1 tablet (4 mg total) by mouth every 8 (eight) hours as needed for nausea or vomiting. 03/20/17  Yes Drema Dallas, DO    Past Medical History:  Diagnosis Date  . Acute back pain with sciatica   . Anxiety   . Depression   . Migraine   . Numbness    face  . Numbness and tingling in hands   . Numbness and tingling of both legs   . Numbness and tingling of right leg   . Ringing in ears   . Urinary incontinence     Past Surgical History:  Procedure Laterality Date  . BACK SURGERY    . DILATION AND CURETTAGE OF UTERUS    . LUMBAR LAMINECTOMY/DECOMPRESSION MICRODISCECTOMY  10/23/2012   Procedure: LUMBAR LAMINECTOMY/DECOMPRESSION MICRODISCECTOMY 1 LEVEL;  Surgeon: Tia Alert, MD;  Location: [facility];  Service: Neurosurgery;  Laterality: Right;  Right Lumbar  five-Sacral one microdiscectomy  . LUMBAR LAMINECTOMY/DECOMPRESSION MICRODISCECTOMY N/A 02/05/2013   Procedure: REDO LUMBAR FIVE SACRAL ONE MICRODISKECTOMY, RIGHT;  Surgeon: Tia Alert, MD;  Location: [facility];  Service: Neurosurgery;  Laterality: N/A;  LUMBAR LAMINECTOMY/DECOMPRESSION MICRODISCECTOMY 1 LEVEL  . SUBOCCIPITAL CRANIECTOMY CERVICAL LAMINECTOMY N/A 03/10/2016   Procedure: Chiari Decompression with cervical laminectomy;  Surgeon: Maeola Harman, MD;  Location: [facility];  Service: Neurosurgery;  Laterality: N/A;  . WISDOM TOOTH EXTRACTION      Social History  Substance Use Topics  . Smoking status: Smoker, Current Status Unknown    Packs/day: 1.00    Years: 8.00    Types: Cigarettes    Last attempt to quit: 03/05/2016  . Smokeless tobacco: Never Used     Comment: "really trying to quit"  . Alcohol use No    Family History  Problem Relation Age of Onset  . Migraines Mother     ROS Per hpi  OBJECTIVE:  Blood pressure 98/64, pulse (!) 106, temperature 99.2 F (37.3 C), temperature source Oral, resp. rate 18, height 5' 9.69" (1.77 m), weight 165 lb 6.4 oz (75 kg), SpO2 95 %.  Physical Exam  Constitutional: She is oriented to person, place, and time and well-developed, well-nourished, and in no distress.  Came in wearing dark sunglasses, lying on exam table in dark room and speaking in whispers.   HENT:  Head: Normocephalic and atraumatic.  Mouth/Throat: Mucous  membranes are normal.  Eyes: Pupils are equal, round, and reactive to light. EOM are normal. No scleral icterus.  Neck: Neck supple.  Pulmonary/Chest: Effort normal.  Neurological: She is alert and oriented to person, place, and time. Gait normal.  Skin: Skin is warm and dry.  Psychiatric: Mood and affect normal.  Nursing note and vitals reviewed.  ASSESSMENT and PLAN  1. Migraine without aura and with status migrainosus, not intractable - ketorolac (TORADOL) injection 60 mg; Inject 2 mLs (60 mg  total) into the muscle once. - promethazine (PHENERGAN) injection 25 mg; Inject 1 mL (25 mg total) into the muscle once.  Return if symptoms worsen or fail to improve.    Myles LippsIrma M Santiago, MD Primary Care at [facility] [address] [place], [place] [address] Ph.  [phone] Fax [phone]

## 2017-07-20 NOTE — Patient Instructions (Signed)
     IF you received an x-ray today, you will receive an invoice from [facility]. Please contact  Radiology at [phone] with questions or concerns regarding your invoice.   IF you received labwork today, you will receive an invoice from [facility]. Please contact [facility] at [phone] with questions or concerns regarding your invoice.   Our billing staff will not be able to assist you with questions regarding bills from these companies.  You will be contacted with the lab results as soon as they are available. The fastest way to get your results is to activate your My Chart account. Instructions are located on the last page of this paperwork. If you have not heard from us regarding the results in 2 weeks, please contact this office.     

## 2017-07-26 ENCOUNTER — Ambulatory Visit: Payer: BLUE CROSS/BLUE SHIELD | Admitting: Urgent Care

## 2017-07-26 ENCOUNTER — Ambulatory Visit (INDEPENDENT_AMBULATORY_CARE_PROVIDER_SITE_OTHER): Payer: BLUE CROSS/BLUE SHIELD | Admitting: Physical Medicine and Rehabilitation

## 2017-07-26 ENCOUNTER — Encounter: Payer: Self-pay | Admitting: Urgent Care

## 2017-07-26 VITALS — BP 110/66 | HR 110 | Temp 98.7°F | Resp 16 | Ht 69.0 in | Wt 165.0 lb

## 2017-07-26 DIAGNOSIS — R112 Nausea with vomiting, unspecified: Secondary | ICD-10-CM | POA: Diagnosis not present

## 2017-07-26 DIAGNOSIS — F419 Anxiety disorder, unspecified: Secondary | ICD-10-CM | POA: Diagnosis not present

## 2017-07-26 DIAGNOSIS — J349 Unspecified disorder of nose and nasal sinuses: Secondary | ICD-10-CM | POA: Diagnosis not present

## 2017-07-26 DIAGNOSIS — F329 Major depressive disorder, single episode, unspecified: Secondary | ICD-10-CM

## 2017-07-26 DIAGNOSIS — R519 Headache, unspecified: Secondary | ICD-10-CM

## 2017-07-26 DIAGNOSIS — R202 Paresthesia of skin: Secondary | ICD-10-CM | POA: Diagnosis not present

## 2017-07-26 DIAGNOSIS — F1290 Cannabis use, unspecified, uncomplicated: Secondary | ICD-10-CM

## 2017-07-26 DIAGNOSIS — R51 Headache: Secondary | ICD-10-CM

## 2017-07-26 MED ORDER — PSEUDOEPHEDRINE HCL 60 MG PO TABS: 60.0000 mg | ORAL_TABLET | Freq: Two times a day (BID) | ORAL | 11 refills | Status: AC

## 2017-07-26 MED ORDER — SERTRALINE HCL 50 MG PO TABS: 50.0000 mg | ORAL_TABLET | Freq: Every day | ORAL | 3 refills | Status: AC

## 2017-07-26 MED ORDER — AMOXICILLIN-POT CLAVULANATE 500-125 MG PO TABS: 1.0000 | ORAL_TABLET | Freq: Three times a day (TID) | ORAL | 0 refills | Status: AC

## 2017-07-26 MED ORDER — CETIRIZINE HCL 10 MG PO TABS: 10.0000 mg | ORAL_TABLET | Freq: Every day | ORAL | 3 refills | Status: AC

## 2017-07-26 MED ORDER — PROMETHAZINE HCL 12.5 MG PO TABS: 12.5000 mg | ORAL_TABLET | Freq: Three times a day (TID) | ORAL | 5 refills | Status: AC | PRN

## 2017-07-26 MED ORDER — FLUTICASONE PROPIONATE 50 MCG/ACT NA SUSP: 2.0000 | Freq: Every day | NASAL | 11 refills | Status: AC

## 2017-07-26 NOTE — Patient Instructions (Addendum)
[facility] [address] [place], [place] [address]  Shanon RosserBarbara Farran [phone]  Maris BergerKathy Kirstner [phone]  Marco CollieSusan Kroll-Smith [phone]   [facility] ([address] Hulan AmatoKincaid, Ernest LeipsicMcCoy, Herbert SetaHeather Joycelyn SchmidKitchens, Karla Stock[place]own[place]) - [phone]  [facility]([address]ie [address] [place]) - [phone]  [facility] - [phone]  [facility] - [phone]  [facility] IrishBob Mylan - [phone]  [facility]  - [phone] (do not file insurance)     Zoloft Start with 1 tablet daily. If you do not experience any changes in your symptoms, then increase to 2 tablets daily. Maintain this dose until follow up with me in 6 weeks.   Sertraline tablets What is this medicine? SERTRALINE (SER tra leen) is used to treat depression. It may also be used to treat obsessive compulsive disorder, panic disorder, post-trauma stress, premenstrual dysphoric disorder (PMDD) or social anxiety. This medicine may be used for other purposes; ask your health care provider or pharmacist if you have questions. COMMON BRAND NAME(S): Zoloft What should I tell my health care provider before I take this medicine? They need to know if you have any of these conditions: -bleeding disorders -bipolar disorder or a family history of bipolar disorder -glaucoma -heart disease -high blood pressure -history of irregular heartbeat -history of low levels of calcium, magnesium, or potassium in the blood -if you often drink alcohol -liver disease -receiving electroconvulsive therapy -seizures -suicidal thoughts, plans, or attempt; a previous suicide attempt by you or a family member -take medicines that treat or prevent blood clots -thyroid disease -an unusual or allergic reaction to sertraline, other medicines, foods, dyes, or preservatives -pregnant or trying to get pregnant -breast-feeding How should I use this medicine? Take  this medicine by mouth with a glass of water. Follow the directions on the prescription label. You can take it with or without food. Take your medicine at regular intervals. Do not take your medicine more often than directed. Do not stop taking this medicine suddenly except upon the advice of your doctor. Stopping this medicine too quickly may cause serious side effects or your condition may worsen. A special MedGuide will be given to you by the pharmacist with each prescription and refill. Be sure to read this information carefully each time. Talk to your pediatrician regarding the use of this medicine in children. While this drug may be prescribed for children as young as 7 years for selected conditions, precautions do apply. Overdosage: If you think you have taken too much of this medicine contact a poison control center or emergency room at once. NOTE: This medicine is only for you. Do not share this medicine with others. What if I miss a dose? If you miss a dose, take it as soon as you can. If it is almost time for your next dose, take only that dose. Do not take double or extra doses. What may interact with this medicine? Do not take this medicine with any of the following medications: -cisapride -dofetilide -dronedarone -linezolid -MAOIs like Carbex, Eldepryl, Marplan, Nardil, and Parnate -methylene blue (injected into a vein) -pimozide -thioridazine This medicine may also interact with the following medications: -alcohol -amphetamines -aspirin and aspirin-like medicines -certain medicines for depression, anxiety, or psychotic disturbances -certain medicines for fungal infections like ketoconazole, fluconazole, posaconazole, and itraconazole -certain medicines for irregular heart beat like flecainide, quinidine, propafenone -certain medicines for migraine headaches like almotriptan, eletriptan, frovatriptan, naratriptan, rizatriptan, sumatriptan, zolmitriptan -certain medicines for  sleep -certain medicines for seizures like carbamazepine, valproic acid,  phenytoin -certain medicines that treat or prevent blood clots like warfarin, enoxaparin, dalteparin -cimetidine -digoxin -diuretics -fentanyl -isoniazid -lithium -NSAIDs, medicines for pain and inflammation, like ibuprofen or naproxen -other medicines that prolong the QT interval (cause an abnormal heart rhythm) -rasagiline -safinamide -supplements like St. John's wort, kava kava, valerian -tolbutamide -tramadol -tryptophan This list may not describe all possible interactions. Give your health care provider a list of all the medicines, herbs, non-prescription drugs, or dietary supplements you use. Also tell them if you smoke, drink alcohol, or use illegal drugs. Some items may interact with your medicine. What should I watch for while using this medicine? Tell your doctor if your symptoms do not get better or if they get worse. Visit your doctor or health care professional for regular checks on your progress. Because it may take several weeks to see the full effects of this medicine, it is important to continue your treatment as prescribed by your doctor. Patients and their families should watch out for new or worsening thoughts of suicide or depression. Also watch out for sudden changes in feelings such as feeling anxious, agitated, panicky, irritable, hostile, aggressive, impulsive, severely restless, overly excited and hyperactive, or not being able to sleep. If this happens, especially at the beginning of treatment or after a change in dose, call your health care professional. Bonita QuinYou may get drowsy or dizzy. Do not drive, use machinery, or do anything that needs mental alertness until you know how this medicine affects you. Do not stand or sit up quickly, especially if you are an older patient. This reduces the risk of dizzy or fainting spells. Alcohol may interfere with the effect of this medicine. Avoid alcoholic  drinks. Your mouth may get dry. Chewing sugarless gum or sucking hard candy, and drinking plenty of water may help. Contact your doctor if the problem does not go away or is severe. What side effects may I notice from receiving this medicine? Side effects that you should report to your doctor or health care professional as soon as possible: -allergic reactions like skin rash, itching or hives, swelling of the face, lips, or tongue -anxious -black, tarry stools -changes in vision -confusion -elevated mood, decreased need for sleep, racing thoughts, impulsive behavior -eye pain -fast, irregular heartbeat -feeling faint or lightheaded, falls -feeling agitated, angry, or irritable -hallucination, loss of contact with reality -loss of balance or coordination -loss of memory -painful or prolonged erections -restlessness, pacing, inability to keep still -seizures -stiff muscles -suicidal thoughts or other mood changes -trouble sleeping -unusual bleeding or bruising -unusually weak or tired -vomiting Side effects that usually do not require medical attention (report to your doctor or health care professional if they continue or are bothersome): -change in appetite or weight -change in sex drive or performance -diarrhea -increased sweating -indigestion, nausea -tremors This list may not describe all possible side effects. Call your doctor for medical advice about side effects. You may report side effects to FDA at 1-800-FDA-1088. Where should I keep my medicine? Keep out of the reach of children. Store at room temperature between 15 and 30 degrees C (59 and 86 degrees F). Throw away any unused medicine after the expiration date. NOTE: This sheet is a summary. It may not cover all possible information. If you have questions about this medicine, talk to your doctor, pharmacist, or health care provider.  2018 Elsevier/Gold Standard (2016-09-08 14:17:49)    IF you received an x-ray  today, you will receive an invoice from [facility]. Please  contact [facility] at [phone] with questions or concerns regarding your invoice.   IF you received labwork today, you will receive an invoice from [facility]. Please contact [facility] at [phone] with questions or concerns regarding your invoice.   Our billing staff will not be able to assist you with questions regarding bills from these companies.  You will be contacted with the lab results as soon as they are available. The fastest way to get your results is to activate your My Chart account. Instructions are located on the last page of this paperwork. If you have not heard from [place] regarding the results in 2 weeks, please contact this office.

## 2017-07-26 NOTE — Progress Notes (Signed)
  MRN: [id number] DOB: 12/11/1985  Subjective:   Rhonda Key is a 31 y.o. female presenting for follow up.   Headaches - Reports ongoing headaches. She expresses concern that her Chiari malformation symptoms are the source still. She had an MRI brain done which did not support this but did show mild ethmoid and sphenoid sinus disease. She is concerned that this may be affecting her CSF levels. Reports dizziness, weakness, joint pains, belly pain, nausea with vomiting. States that she has not eaten in 3 days, is able to tolerate some breads, very little water. She is trying to cut back on smoking marijuana but feels this is the only thing that helps. Admits that she used phenergan to help with her nausea and appetite and thinks it can help. Zofran only helps with nausea. She has been seen by ortho regarding her wrists.   Rhonda Key has a current medication list which includes the following prescription(s): etonogestrel and ondansetron. Also has No Known Allergies.  Rhonda Key  has a past medical history of Acute back pain with sciatica, Anxiety, Depression, Migraine, Numbness, Numbness and tingling in hands, Numbness and tingling of both legs, Numbness and tingling of right leg, Ringing in ears, and Urinary incontinence. Also  has a past surgical history that includes Dilation and curettage of uterus; Back surgery; Wisdom tooth extraction; Chiari Decompression with cervical laminectomy (N/A, 03/10/2016); REDO LUMBAR FIVE SACRAL ONE MICRODISKECTOMY, RIGHT (N/A, 02/05/2013); and LUMBAR LAMINECTOMY/DECOMPRESSION MICRODISCECTOMY 1 LEVEL (Right, 10/23/2012).  Objective:   Vitals: BP 110/66   Pulse (!) 110   Temp 98.7 F (37.1 C) (Oral)   Resp 16   Ht 5\' 9"  (1.753 m)   Wt 165 lb (74.8 kg)   SpO2 96%   BMI 24.37 kg/m   Physical Exam  Constitutional: She is oriented to person, place, and time. She appears well-developed and well-nourished.  Cardiovascular: Normal rate.  Pulmonary/Chest: Effort normal.    Neurological: She is alert and oriented to person, place, and time.  Psychiatric: Her mood appears anxious. Her affect is not blunt, not labile and not inappropriate. Her speech is not rapid and/or pressured and not tangential. She is slowed. She is not agitated, not hyperactive and not withdrawn. She exhibits a depressed mood. She expresses no homicidal and no suicidal ideation.  Patient had flat affect, was tearful throughout exam, had slowed speech.   Assessment and Plan :   1. Frequent headaches 2. Sinus disease - Will start patient on Zyrtec, Flonase for allergic rhinitis which may be associated with smoking marijuana. She is to take Augmentin to address infection from chronic sinus disease. Use Sudafed for supportive care. Referral to ENT to address sinus disease.   3. Marijuana use - Emphasized need to quit smoking marijuana. Patient is not very agreeable. Will f/u with this.  4. Nausea and vomiting, intractability of vomiting not specified, unspecified vomiting type - Use phenergan for symptomatic relief.  5. Anxiety and depression - Will have patient start Zoloft with instructions to titrate up dosing depending on tolerance of the medication. She is to contact a therapist as well to get started with therapy.   A total of 45 minutes were spent in the room with the patient, greater than 50% of which was in counseling/coordination of care regarding treatment and plan for follow up.   Wallis BambergMario Thadius Smisek, PA-C [facility] at [facility] [phone] 07/26/2017  2:05 PM

## 2017-07-26 NOTE — Progress Notes (Signed)
Rhonda Key - 31 y.o. female MRN [id number]  Date of birth: [birth year]  Office Visit Note: Visit Date: 07/26/2017 PCP: Wallis Bamberg, PA-C Referred by: Wallis Bamberg, PA-C  Subjective: Chief Complaint  Patient presents with  . Right Hand - Pain, Numbness  . Left Hand - Pain, Numbness  . Right Elbow - Pain   HPI: Rhonda Key is a 31 year old right-hand-dominant female who comes in today at the request of Dr. Roda Shutters for electrodiagnostic studies of both upper extremities.  She reports approximately a year and a half of worsening hand pain with numbness and tingling right much more than the left.  The numbness and tingling is somewhat nondermatomal.  It will refer up the arm to the elbow.  It is worse at night.  Is worse with activity.  She does a lot of typing at work and she thinks this is exacerbating the problem.  She has not had prior electrodiagnostic studies.  She does not complain of any frank radicular pain.  She has not had any specific trauma.  Her history is problematic with Arnold-Chiari malformation status post decompression over a year and a half ago.  She also has chronic daily headaches which are questionably migraine.  She is followed in neurology by Dr. Shon Millet.  She reports being in a support group for Arnold-Chiari suffers.    ROS Otherwise per HPI.  Assessment & Plan: Visit Diagnoses:  1. Paresthesia of skin     Plan: No additional findings.  Impression: Essentially NORMAL electrodiagnostic study of both upper limbs.    There is no significant electrodiagnostic evidence of nerve entrapment, brachial plexopathy, cervical radiculopathy or generalized peripheral neuropathy.  As you know, purely sensory or demyelinating radiculopathies and chemical radiculitis may not be detected with this particular electrodiagnostic study.  This electrodiagnostic study cannot rule out small fiber polyneuropathy and dysesthesias from central pain sensitization syndromes such as  fibromyalgia.  Recommendations: 1.  Follow-up with referring physician. 2.  Continue current management of symptoms.   Meds & Orders: No orders of the defined types were placed in this encounter.   Orders Placed This Encounter  Procedures  . NCV with EMG (electromyography)    Follow-up: Return for Dr. Roda Shutters.   Procedures: No procedures performed  EMG & NCV Findings: All nerve conduction studies (as indicated in the following tables) were within normal limits.  All left vs. right side differences were within normal limits.    All examined muscles (as indicated in the following table) showed no evidence of electrical instability.    Impression: Essentially NORMAL electrodiagnostic study of both upper limbs.    There is no significant electrodiagnostic evidence of nerve entrapment, brachial plexopathy, cervical radiculopathy or generalized peripheral neuropathy.  As you know, purely sensory or demyelinating radiculopathies and chemical radiculitis may not be detected with this particular electrodiagnostic study.  This electrodiagnostic study cannot rule out small fiber polyneuropathy and dysesthesias from central pain sensitization syndromes such as fibromyalgia.  Recommendations: 1.  Follow-up with referring physician. 2.  Continue current management of symptoms.   Nerve Conduction Studies Anti Sensory Summary Table   Stim Site NR Peak (ms) Norm Peak (ms) P-T Amp (V) Norm P-T Amp Site1 Site2 Delta-P (ms) Dist (cm) Vel (m/s) Norm Vel (m/s)  Left Median Acr Palm Anti Sensory (2nd Digit)  32C  Wrist    2.9 <3.6 73.7 >10 Wrist Palm 1.3 0.0    Palm    1.6 <2.0 62.3  Right Median Acr Palm Anti Sensory (2nd Digit)  31.5C  Wrist    2.9 <3.6 60.7 >10 Wrist Palm 1.3 0.0    Palm    1.6 <2.0 70.9         Left Radial Anti Sensory (Base 1st Digit)  32.2C  Wrist    2.0 <3.1 40.1  Wrist Base 1st Digit 2.0 0.0    Right Radial Anti Sensory (Base 1st Digit)  31.8C  Wrist    1.9  <3.1 25.3  Wrist Base 1st Digit 1.9 0.0    Left Ulnar Anti Sensory (5th Digit)  32.4C  Wrist    2.8 <3.7 51.8 >15.0 Wrist 5th Digit 2.8 14.0 50 >38  Right Ulnar Anti Sensory (5th Digit)  31.8C  Wrist    2.9 <3.7 46.4 >15.0 Wrist 5th Digit 2.9 14.0 48 >38   Motor Summary Table   Stim Site NR Onset (ms) Norm Onset (ms) O-P Amp (mV) Norm O-P Amp Site1 Site2 Delta-0 (ms) Dist (cm) Vel (m/s) Norm Vel (m/s)  Left Median Motor (Abd Poll Brev)  32.2C  Wrist    3.1 <4.2 8.1 >5 Elbow Wrist 4.3 23.0 53 >50  Elbow    7.4  8.0         Right Median Motor (Abd Poll Brev)  31.9C  Wrist    2.9 <4.2 6.9 >5 Elbow Wrist 4.4 22.5 51 >50  Elbow    7.3  4.2         Left Ulnar Motor (Abd Dig Min)  32.7C  Wrist    2.7 <4.2 8.9 >3 B Elbow Wrist 3.7 23.0 62 >53  B Elbow    6.4  9.2  A Elbow B Elbow 1.3 9.0 69 >53  A Elbow    7.7  9.1         Right Ulnar Motor (Abd Dig Min)  31.9C  Wrist    3.1 <4.2 10.4 >3 B Elbow Wrist 3.5 22.0 63 >53  B Elbow    6.6  9.6  A Elbow B Elbow 1.3 9.0 69 >53  A Elbow    7.9  9.2          EMG   Side Muscle Nerve Root Ins Act Fibs Psw Amp Dur Poly Recrt Int Dennie BiblePat Comment  Right Abd Poll Brev Median C8-T1 Nml Nml Nml Nml Nml 0 Nml Nml   Right 1stDorInt Ulnar C8-T1 Nml Nml Nml Nml Nml 0 Nml Nml   Right PronatorTeres Median C6-7 Nml Nml Nml Nml Nml 0 Nml Nml   Right ExtDigCom Radial C7-8 Nml Nml Nml Nml Nml 0 Nml Nml   Right Triceps Radial C6-8 Nml Nml Nml Nml Nml 0 Nml Nml   Right Deltoid Axillary C5-6 Nml Nml Nml Nml Nml 0 Nml Nml     Nerve Conduction Studies Anti Sensory Left/Right Comparison   Stim Site L Lat (ms) R Lat (ms) L-R Lat (ms) L Amp (V) R Amp (V) L-R Amp (%) Site1 Site2 L Vel (m/s) R Vel (m/s) L-R Vel (m/s)  Median Acr Palm Anti Sensory (2nd Digit)  32C  Wrist 2.9 2.9 0.0 73.7 60.7 17.6 Wrist Palm     Palm 1.6 1.6 0.0 62.3 70.9 12.1       Radial Anti Sensory (Base 1st Digit)  32.2C  Wrist 2.0 1.9 0.1 40.1 25.3 36.9 Wrist Base 1st Digit     Ulnar Anti  Sensory (5th Digit)  32.4C  Wrist 2.8 2.9 0.1 51.8 46.4 10.4 Wrist  5th Digit 50 48 2   Motor Left/Right Comparison   Stim Site L Lat (ms) R Lat (ms) L-R Lat (ms) L Amp (mV) R Amp (mV) L-R Amp (%) Site1 Site2 L Vel (m/s) R Vel (m/s) L-R Vel (m/s)  Median Motor (Abd Poll Brev)  32.2C  Wrist 3.1 2.9 0.2 8.1 6.9 14.8 Elbow Wrist 53 51 2  Elbow 7.4 7.3 0.1 8.0 4.2 47.5       Ulnar Motor (Abd Dig Min)  32.7C  Wrist 2.7 3.1 0.4 8.9 10.4 14.4 B Elbow Wrist 62 63 1  B Elbow 6.4 6.6 0.2 9.2 9.6 4.2 A Elbow B Elbow 69 69 0  A Elbow 7.7 7.9 0.2 9.1 9.2 1.1          Waveforms:                     Clinical History: No specialty comments available.  She reports that she has been smoking cigarettes.  She has a 8.00 pack-year smoking history. she has never used smokeless tobacco. No results for input(s): HGBA1C, LABURIC in the last 8760 hours.  Objective:  VS:  HT:    WT:   BMI:     BP:   HR: bpm  TEMP: ( )  RESP:  Physical Exam  HENT:  Patient is wearing sunglasses during the visit due to headache.  Cardiovascular: Normal rate, regular rhythm and intact distal pulses.  Pulmonary/Chest: Effort normal.  Musculoskeletal:  Inspection reveals no atrophy of the bilateral APB or FDI or hand intrinsics. There is no swelling, color changes, allodynia or dystrophic changes. There is 5 out of 5 strength in the bilateral wrist extension, finger abduction and long finger flexion. There is intact sensation to light touch in all dermatomal and peripheral nerve distributions. There is a negative Tinel's test at the bilateral wrist and elbow.  There is a negative Hoffmann's test bilaterally.  Neurological: She exhibits normal muscle tone. Coordination normal.  Skin: Skin is warm and dry. No erythema.  Psychiatric: She has a normal mood and affect.    Ortho Exam Imaging: No results found.  Past Medical/Family/Surgical/Social History: Medications & Allergies reviewed per EMR Patient Active  Problem List   Diagnosis Date Noted  . 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 Contraception 01/24/2016  . Arnold-Chiari malformation (HCC) 01/24/2016  . Cephalalgia 01/13/2016   Past Medical History:  Diagnosis Date  . Acute back pain with sciatica   . Anxiety   . Depression   . Migraine   . Numbness    face  . Numbness and tingling in hands   . Numbness and tingling of both legs   . Numbness and tingling of right leg   . Ringing in ears   . Urinary incontinence    Family History  Problem Relation Age of Onset  . Migraines Mother    Past Surgical History:  Procedure Laterality Date  . BACK SURGERY    . DILATION AND CURETTAGE OF UTERUS    . WISDOM TOOTH EXTRACTION     Social History   Occupational History  . Not on file  Tobacco Use  . Smoking status: Smoker, Current Status Unknown    Packs/day: 1.00    Years: 8.00    Pack years: 8.00    Types: Cigarettes    Last attempt to quit: 03/05/2016    Years since quitting: 1.3  . Smokeless tobacco: Never Used  . Tobacco comment: "really trying to quit"  Substance and Sexual Activity  . Alcohol use: No    Alcohol/week: 0.0 oz  . Drug use: Yes    Types: Marijuana    Comment: daily  . Sexual activity: Yes    Birth control/protection: None

## 2017-07-27 ENCOUNTER — Encounter (INDEPENDENT_AMBULATORY_CARE_PROVIDER_SITE_OTHER): Payer: Self-pay | Admitting: Physical Medicine and Rehabilitation

## 2017-07-27 NOTE — Procedures (Signed)
EMG & NCV Findings: All nerve conduction studies (as indicated in the following tables) were within normal limits.  All left vs. right side differences were within normal limits.    All examined muscles (as indicated in the following table) showed no evidence of electrical instability.    Impression: Essentially NORMAL electrodiagnostic study of both upper limbs.    There is no significant electrodiagnostic evidence of nerve entrapment, brachial plexopathy, cervical radiculopathy or generalized peripheral neuropathy.  As you know, purely sensory or demyelinating radiculopathies and chemical radiculitis may not be detected with this particular electrodiagnostic study.  This electrodiagnostic study cannot rule out small fiber polyneuropathy and dysesthesias from central pain sensitization syndromes such as fibromyalgia.  Recommendations: 1.  Follow-up with referring physician. 2.  Continue current management of symptoms.   Nerve Conduction Studies Anti Sensory Summary Table   Stim Site NR Peak (ms) Norm Peak (ms) P-T Amp (V) Norm P-T Amp Site1 Site2 Delta-P (ms) Dist (cm) Vel (m/s) Norm Vel (m/s)  Left Median Acr Palm Anti Sensory (2nd Digit)  32C  Wrist    2.9 <3.6 73.7 >10 Wrist Palm 1.3 0.0    Palm    1.6 <2.0 62.3         Right Median Acr Palm Anti Sensory (2nd Digit)  31.5C  Wrist    2.9 <3.6 60.7 >10 Wrist Palm 1.3 0.0    Palm    1.6 <2.0 70.9         Left Radial Anti Sensory (Base 1st Digit)  32.2C  Wrist    2.0 <3.1 40.1  Wrist Base 1st Digit 2.0 0.0    Right Radial Anti Sensory (Base 1st Digit)  31.8C  Wrist    1.9 <3.1 25.3  Wrist Base 1st Digit 1.9 0.0    Left Ulnar Anti Sensory (5th Digit)  32.4C  Wrist    2.8 <3.7 51.8 >15.0 Wrist 5th Digit 2.8 14.0 50 >38  Right Ulnar Anti Sensory (5th Digit)  31.8C  Wrist    2.9 <3.7 46.4 >15.0 Wrist 5th Digit 2.9 14.0 48 >38   Motor Summary Table   Stim Site NR Onset (ms) Norm Onset (ms) O-P Amp (mV) Norm O-P Amp Site1 Site2  Delta-0 (ms) Dist (cm) Vel (m/s) Norm Vel (m/s)  Left Median Motor (Abd Poll Brev)  32.2C  Wrist    3.1 <4.2 8.1 >5 Elbow Wrist 4.3 23.0 53 >50  Elbow    7.4  8.0         Right Median Motor (Abd Poll Brev)  31.9C  Wrist    2.9 <4.2 6.9 >5 Elbow Wrist 4.4 22.5 51 >50  Elbow    7.3  4.2         Left Ulnar Motor (Abd Dig Min)  32.7C  Wrist    2.7 <4.2 8.9 >3 B Elbow Wrist 3.7 23.0 62 >53  B Elbow    6.4  9.2  A Elbow B Elbow 1.3 9.0 69 >53  A Elbow    7.7  9.1         Right Ulnar Motor (Abd Dig Min)  31.9C  Wrist    3.1 <4.2 10.4 >3 B Elbow Wrist 3.5 22.0 63 >53  B Elbow    6.6  9.6  A Elbow B Elbow 1.3 9.0 69 >53  A Elbow    7.9  9.2          EMG   Side Muscle Nerve Root Ins Act Fibs Psw Amp  Dur Poly Recrt Int Dennie BiblePat Comment  Right Abd Poll Brev Median C8-T1 Nml Nml Nml Nml Nml 0 Nml Nml   Right 1stDorInt Ulnar C8-T1 Nml Nml Nml Nml Nml 0 Nml Nml   Right PronatorTeres Median C6-7 Nml Nml Nml Nml Nml 0 Nml Nml   Right ExtDigCom Radial C7-8 Nml Nml Nml Nml Nml 0 Nml Nml   Right Triceps Radial C6-8 Nml Nml Nml Nml Nml 0 Nml Nml   Right Deltoid Axillary C5-6 Nml Nml Nml Nml Nml 0 Nml Nml     Nerve Conduction Studies Anti Sensory Left/Right Comparison   Stim Site L Lat (ms) R Lat (ms) L-R Lat (ms) L Amp (V) R Amp (V) L-R Amp (%) Site1 Site2 L Vel (m/s) R Vel (m/s) L-R Vel (m/s)  Median Acr Palm Anti Sensory (2nd Digit)  32C  Wrist 2.9 2.9 0.0 73.7 60.7 17.6 Wrist Palm     Palm 1.6 1.6 0.0 62.3 70.9 12.1       Radial Anti Sensory (Base 1st Digit)  32.2C  Wrist 2.0 1.9 0.1 40.1 25.3 36.9 Wrist Base 1st Digit     Ulnar Anti Sensory (5th Digit)  32.4C  Wrist 2.8 2.9 0.1 51.8 46.4 10.4 Wrist 5th Digit 50 48 2   Motor Left/Right Comparison   Stim Site L Lat (ms) R Lat (ms) L-R Lat (ms) L Amp (mV) R Amp (mV) L-R Amp (%) Site1 Site2 L Vel (m/s) R Vel (m/s) L-R Vel (m/s)  Median Motor (Abd Poll Brev)  32.2C  Wrist 3.1 2.9 0.2 8.1 6.9 14.8 Elbow Wrist 53 51 2  Elbow 7.4 7.3 0.1 8.0  4.2 47.5       Ulnar Motor (Abd Dig Min)  32.7C  Wrist 2.7 3.1 0.4 8.9 10.4 14.4 B Elbow Wrist 62 63 1  B Elbow 6.4 6.6 0.2 9.2 9.6 4.2 A Elbow B Elbow 69 69 0  A Elbow 7.7 7.9 0.2 9.1 9.2 1.1          Waveforms:

## 2017-07-30 ENCOUNTER — Telehealth: Payer: Self-pay | Admitting: Family Medicine

## 2017-07-30 NOTE — Telephone Encounter (Signed)
Copied from CRM #[id number]. Topic: General - Other >> Jul 27, 2017  1:45 PM Clack, Princella PellegriniJessica D wrote: Reason for CRM: Marylu LundJanet with the [employer] group wanted to verify if the paperwork they faxed on 07/26/17 was recv'd.  Contact [phone]

## 2017-07-31 ENCOUNTER — Ambulatory Visit (INDEPENDENT_AMBULATORY_CARE_PROVIDER_SITE_OTHER): Payer: BLUE CROSS/BLUE SHIELD | Admitting: Orthopaedic Surgery

## 2017-07-31 DIAGNOSIS — M79641 Pain in right hand: Secondary | ICD-10-CM | POA: Diagnosis not present

## 2017-07-31 DIAGNOSIS — M79642 Pain in left hand: Secondary | ICD-10-CM

## 2017-07-31 MED ORDER — DICLOFENAC SODIUM 1 % TD GEL: 2.0000 g | Freq: Four times a day (QID) | TRANSDERMAL | 5 refills | Status: AC

## 2017-07-31 NOTE — Progress Notes (Signed)
   Office Visit Note   Patient: Rhonda Key           Date of Birth: March 10, 1986           MRN: [id number] Visit Date: 07/31/2017              Requested by: Wallis BambergMani, Mario, PA-C [address] [place], [place] [id number] PCP: Wallis BambergMani, Mario, PA-C   Assessment & Plan: Visit Diagnoses:  1. Pain in both hands     Plan: Patient has bilateral hand pain and numbness that has negative carpal tunnel nerve conduction studies.  Voltaren gel was prescribed to her.  Recommend seeing Dr. Venetia MaxonStern to see if this is related to her Chiari malformation.  Follow-Up Instructions: Return if symptoms worsen or fail to improve.   Orders:  No orders of the defined types were placed in this encounter.  Meds ordered this encounter  Medications  . diclofenac sodium (VOLTAREN) 1 % GEL    Sig: Apply 2 g 4 (four) times daily topically.    Dispense:  1 Tube    Refill:  5      Procedures: No procedures performed   Clinical Data: No additional findings.   Subjective: No chief complaint on file.   Rhonda Key follows up today for follow-up of nerve conduction studies which were normal.  She is also complaining of some stiffness in her hands.    Review of Systems   Objective: Vital Signs: There were no vitals taken for this visit.  Physical Exam  Ortho Exam Exam is stable. Specialty Comments:  No specialty comments available.  Imaging: No results found.   PMFS History: Patient Active Problem List   Diagnosis Date Noted  . 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 Contraception 01/24/2016  . Arnold-Chiari malformation (HCC) 01/24/2016  . Cephalalgia 01/13/2016   Past Medical History:  Diagnosis Date  . Acute back pain with sciatica   . Anxiety   . Depression   . Migraine   . Numbness    face  . Numbness and tingling in hands   . Numbness and tingling of both legs   . Numbness and tingling of right leg   . Ringing in ears   . Urinary incontinence     Family History  Problem  Relation Age of Onset  . Migraines Mother     Past Surgical History:  Procedure Laterality Date  . BACK SURGERY    . DILATION AND CURETTAGE OF UTERUS    . WISDOM TOOTH EXTRACTION     Social History   Occupational History  . Not on file  Tobacco Use  . Smoking status: Smoker, Current Status Unknown    Packs/day: 1.00    Years: 8.00    Pack years: 8.00    Types: Cigarettes    Last attempt to quit: 03/05/2016    Years since quitting: 1.4  . Smokeless tobacco: Never Used  . Tobacco comment: "really trying to quit"  Substance and Sexual Activity  . Alcohol use: No    Alcohol/week: 0.0 oz  . Drug use: Yes    Types: Marijuana    Comment: daily  . Sexual activity: Yes    Birth control/protection: None

## 2017-08-01 NOTE — Telephone Encounter (Signed)
Paperwork was received and will be processed today 08/01/17

## 2017-08-01 NOTE — Telephone Encounter (Signed)
[employer] needs a provider statement for this patient based off her last OV on 07/26/17. I completed what I could from her OV notes and highlighted what I was not sure about I will place the blank forms in Mani's box on 08/01/17 please return to the FMLA/Disability box at the 102 checkout desk within 5-7 business days. Thank you!

## 2017-08-08 NOTE — Telephone Encounter (Signed)
FMLA initiated. Patient needs to rtc as soon as possible when I return the first week of December. Please schedule patient.

## 2017-08-08 NOTE — Telephone Encounter (Signed)
Can you complete forms now or do you need to see patient first?

## 2017-08-08 NOTE — Telephone Encounter (Signed)
Forms completed. I'll put them in the FMLA box. Sorry I wasn't clearer.

## 2017-08-10 NOTE — Telephone Encounter (Signed)
Paperwork scanned and faxed on 08/10/17 °

## 2017-08-13 ENCOUNTER — Ambulatory Visit: Payer: BLUE CROSS/BLUE SHIELD | Admitting: Neurology

## 2017-08-21 DIAGNOSIS — Q0700 Arnold-Chiari syndrome without spina bifida or hydrocephalus: Secondary | ICD-10-CM

## 2017-09-19 ENCOUNTER — Telehealth: Payer: Self-pay

## 2017-09-19 NOTE — Telephone Encounter (Signed)
Copied from CRM [phone]. Topic: General - Other >> Sep 19, 2017  1:31 PM Jolayne Hainesaylor, Brittany L wrote: Thurston Holenne from [employer] needs an update through December for disability paperwork. They need an update every month when giving a behavorial health diagnose. Call back is [phone] ext [phone]

## 2017-09-21 NOTE — Telephone Encounter (Signed)
Ann called back today requesting to speak to Jonny RuizJohn  Concerning the paperwork for the pts disability, she requests he call her at  [phone] ext [phone]

## 2017-09-25 ENCOUNTER — Telehealth: Payer: Self-pay | Admitting: Urgent Care

## 2017-09-25 NOTE — Telephone Encounter (Signed)
Patient stated that [employer] needs an updated mental health eval for her FMLA time. They have to have a form completed every 30 days since it is dealing with a mental health issue. I completed what I could from the OV notes and highlighted what needs to be completed. I will place the forms in Mani's box on 09/26/17 please return to the FMLA/Disability box at the 102 checkout desk within 5-7 business days. Thank you!

## 2017-09-25 NOTE — Telephone Encounter (Signed)
Just got the fax today for more information, it will be processed in 5-7 business days.

## 2017-09-26 NOTE — Telephone Encounter (Signed)
Do you want to complete the forms based off her last OV or do you want me to contact them and let them know that we cannot complete the forms at this time.

## 2017-09-26 NOTE — Telephone Encounter (Signed)
No I cannot complete forms without seeing the patient. She has a very complex set of symptoms and work up has all been negative so I cannot justify her missing so much work. She needs to be seen to have forms completed. Thank you for your effort with this.

## 2017-09-26 NOTE — Telephone Encounter (Signed)
Spoke with pt today (09/26/17) and she states that she is waiting on money to pay her insurance before she can get an appt scheduled. She was not sure of the time line of when she will be able to.  Thanks!

## 2017-09-26 NOTE — Telephone Encounter (Signed)
Patient needs OV.  Please schedule. 

## 2017-09-26 NOTE — Telephone Encounter (Signed)
Ok I will try to call her and explain that to her, just place the forms back in my box. Thank you

## 2017-12-15 ENCOUNTER — Encounter (HOSPITAL_COMMUNITY): Payer: Self-pay | Admitting: Emergency Medicine

## 2017-12-15 ENCOUNTER — Emergency Department (HOSPITAL_COMMUNITY)
Admission: EM | Admit: 2017-12-15 | Discharge: 2017-12-15 | Disposition: A | Discharge status: Home / Self Care | Payer: BLUE CROSS/BLUE SHIELD | Attending: Emergency Medicine | Admitting: Emergency Medicine

## 2017-12-15 DIAGNOSIS — Z79899 Other long term (current) drug therapy: Secondary | ICD-10-CM | POA: Insufficient documentation

## 2017-12-15 DIAGNOSIS — M5431 Sciatica, right side: Secondary | ICD-10-CM

## 2017-12-15 DIAGNOSIS — F17210 Nicotine dependence, cigarettes, uncomplicated: Secondary | ICD-10-CM | POA: Insufficient documentation

## 2017-12-15 DIAGNOSIS — R1110 Vomiting, unspecified: Secondary | ICD-10-CM | POA: Insufficient documentation

## 2017-12-15 LAB — URINALYSIS, ROUTINE W REFLEX MICROSCOPIC
Bacteria, UA: NONE SEEN
Bilirubin Urine: NEGATIVE
GLUCOSE, UA: NEGATIVE mg/dL
KETONES UR: 20 mg/dL — AB
LEUKOCYTES UA: NEGATIVE
Nitrite: NEGATIVE
PH: 7 (ref 5.0–8.0)
Protein, ur: NEGATIVE mg/dL
SPECIFIC GRAVITY, URINE: 1.023 (ref 1.005–1.030)

## 2017-12-15 LAB — CBC
HCT: 41.3 % (ref 36.0–46.0)
Hemoglobin: 14.3 g/dL (ref 12.0–15.0)
MCH: 30.2 pg (ref 26.0–34.0)
MCHC: 34.6 g/dL (ref 30.0–36.0)
MCV: 87.3 fL (ref 78.0–100.0)
Platelets: 250 10*3/uL (ref 150–400)
RBC: 4.73 MIL/uL (ref 3.87–5.11)
RDW: 12.6 % (ref 11.5–15.5)
WBC: 15.6 10*3/uL — AB (ref 4.0–10.5)

## 2017-12-15 LAB — LIPASE, BLOOD: Lipase: 20 U/L (ref 11–51)

## 2017-12-15 LAB — COMPREHENSIVE METABOLIC PANEL
ALK PHOS: 65 U/L (ref 38–126)
ALT: 15 U/L (ref 14–54)
AST: 20 U/L (ref 15–41)
Albumin: 4.1 g/dL (ref 3.5–5.0)
Anion gap: 9 (ref 5–15)
BILIRUBIN TOTAL: 0.7 mg/dL (ref 0.3–1.2)
BUN: 10 mg/dL (ref 6–20)
CALCIUM: 9 mg/dL (ref 8.9–10.3)
CO2: 21 mmol/L — ABNORMAL LOW (ref 22–32)
Chloride: 108 mmol/L (ref 101–111)
Creatinine, Ser: 0.74 mg/dL (ref 0.44–1.00)
Glucose, Bld: 123 mg/dL — ABNORMAL HIGH (ref 65–99)
Potassium: 3.9 mmol/L (ref 3.5–5.1)
Sodium: 138 mmol/L (ref 135–145)
Total Protein: 7 g/dL (ref 6.5–8.1)

## 2017-12-15 LAB — I-STAT BETA HCG BLOOD, ED (MC, WL, AP ONLY): I-stat hCG, quantitative: <5 m[IU]/mL (ref <5)

## 2017-12-15 MED ORDER — KETOROLAC TROMETHAMINE 30 MG/ML IJ SOLN
30.0000 mg | Freq: Once | INTRAMUSCULAR | Status: AC
  Administered 2017-12-15: 30 mg via INTRAVENOUS
  Filled 2017-12-15: qty 1

## 2017-12-15 MED ORDER — SODIUM CHLORIDE 0.9 % IV BOLUS
1000.0000 mL | Freq: Once | INTRAVENOUS | Status: AC
  Administered 2017-12-15: 1000 mL via INTRAVENOUS

## 2017-12-15 MED ORDER — HYDROMORPHONE HCL 1 MG/ML IJ SOLN
1.0000 mg | Freq: Once | INTRAMUSCULAR | Status: AC
  Administered 2017-12-15: 1 mg via INTRAVENOUS
  Filled 2017-12-15: qty 1

## 2017-12-15 MED ORDER — ONDANSETRON HCL 4 MG/2ML IJ SOLN
4.0000 mg | Freq: Once | INTRAMUSCULAR | Status: AC | PRN
  Administered 2017-12-15: 4 mg via INTRAVENOUS
  Filled 2017-12-15: qty 2

## 2017-12-15 MED ORDER — OXYCODONE-ACETAMINOPHEN 5-325 MG PO TABS: 1.0000 | ORAL_TABLET | Freq: Four times a day (QID) | ORAL | 0 refills | Status: AC | PRN

## 2017-12-15 NOTE — Discharge Instructions (Signed)
Return to the ED with any concerns including weakness of legs, not able to urinate, loss of bowel or bladder, fever/chills, decreased level of alertness/lethargy, or any other alarming symptoms

## 2017-12-15 NOTE — ED Triage Notes (Signed)
Per [facility] pt having sciatic nerve that is on going and n/v that started last night and not controlled with Zofran. Pt reports that she is unable to sit into wheelchair due to back pain.  20g left wrist/forearm given Zofran 4mg  and NS 400ML in route.

## 2017-12-15 NOTE — ED Provider Notes (Signed)
[facility]-EMERGENCY DEPT Provider Note   CSN: [id number] Arrival date & time: 12/15/17  1257     History   Chief Complaint Chief Complaint  Patient presents with  . Sciatica  . Emesis    HPI Rhonda Key is a 32 y.o. female.  HPI  Patient with history of sciatica as well as multiple lower back surgeries presents with right-sided low back pain with radiation down behind her right knee.  She states that symptoms became much worse yesterday but have been worsening over several weeks.  She states she had been gardening and does not bend over and tries to be careful but pain has been increasing.  Today she has begun having emesis and is not able to keep down anything by mouth.  She denies abdominal pain.  She tried Zofran and Phenergan suppository and still continued vomiting at home.  She has no weakness in her leg.  No incontinence of bowel or bladder.  She does have some symptoms of saddle anesthesia which are resultant of her prior back surgeries.  No fever or chills.  No specific injuries or falls.  Pain is worse with movement and palpation.  There are no other associated systemic symptoms, there are no other alleviating or modifying factors.   . Past Medical History:  Diagnosis Date  . Acute back pain with sciatica   . Anxiety   . Depression   . Migraine   . Numbness    face  . Numbness and tingling in hands   . Numbness and tingling of both legs   . Numbness and tingling of right leg   . Ringing in ears   . Urinary incontinence     Patient Active Problem List   Diagnosis Date Noted  . 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 Contraception 01/24/2016  . Arnold-Chiari malformation (HCC) 01/24/2016  . Cephalalgia 01/13/2016    Past Surgical History:  Procedure Laterality Date  . BACK SURGERY    . DILATION AND CURETTAGE OF UTERUS    . LUMBAR LAMINECTOMY/DECOMPRESSION MICRODISCECTOMY  10/23/2012   Procedure: LUMBAR LAMINECTOMY/DECOMPRESSION  MICRODISCECTOMY 1 LEVEL;  Surgeon: Tia Alertavid S Jones, MD;  Location: [facility];  Service: Neurosurgery;  Laterality: Right;  Right Lumbar five-Sacral one microdiscectomy  . LUMBAR LAMINECTOMY/DECOMPRESSION MICRODISCECTOMY N/A 02/05/2013   Procedure: REDO LUMBAR FIVE SACRAL ONE MICRODISKECTOMY, RIGHT;  Surgeon: Tia Alertavid S Jones, MD;  Location: [facility];  Service: Neurosurgery;  Laterality: N/A;  LUMBAR LAMINECTOMY/DECOMPRESSION MICRODISCECTOMY 1 LEVEL  . SUBOCCIPITAL CRANIECTOMY CERVICAL LAMINECTOMY N/A 03/10/2016   Procedure: Chiari Decompression with cervical laminectomy;  Surgeon: Maeola HarmanJoseph Stern, MD;  Location: [facility];  Service: Neurosurgery;  Laterality: N/A;  . WISDOM TOOTH EXTRACTION       OB History   None      Home Medications    Prior to Admission medications   Medication Sig Start Date End Date Taking? Authorizing Provider  acetaminophen (TYLENOL) 500 MG tablet Take 1,000 mg by mouth every 6 (six) hours as needed for mild pain.   Yes [provider]  etonogestrel (NEXPLANON) 68 MG IMPL implant 1 each by Subdermal route once. Reported on 03/10/2016   Yes [provider]  fluticasone (FLONASE) 50 MCG/ACT nasal spray Place 2 sprays daily into both nostrils. 07/26/17  Yes Wallis BambergMani, Mario, PA-C  ibuprofen (ADVIL,MOTRIN) 200 MG tablet Take 200-400 mg by mouth every 6 (six) hours as needed for mild pain or moderate pain.   Yes [provider]  ondansetron (ZOFRAN) 4 MG tablet Take 1 tablet (4 mg total) by mouth every 8 (eight) hours as needed for nausea or vomiting. 03/20/17  Yes Jaffe, Adam R, DO  promethazine (PHENERGAN) 25 MG suppository Place 25 mg rectally every 6 (six) hours as needed for nausea or vomiting.   Yes [provider]  amoxicillin-clavulanate (AUGMENTIN) 500-125 MG tablet Take 1 tablet (500 mg total) 3 (three) times daily by mouth. Patient not taking: Reported on 12/15/2017 07/26/17   Wallis Bamberg, PA-C  cetirizine (ZYRTEC) 10 MG tablet Take 1  tablet (10 mg total) daily by mouth. Patient not taking: Reported on 12/15/2017 07/26/17   Wallis Bamberg, PA-C  diclofenac sodium (VOLTAREN) 1 % GEL Apply 2 g 4 (four) times daily topically. Patient not taking: Reported on 12/15/2017 07/31/17   Tarry Kos, MD  oxyCODONE-acetaminophen (PERCOCET/ROXICET) 5-325 MG tablet Take 1-2 tablets by mouth every 6 (six) hours as needed for severe pain. 12/15/17   Taisia Fantini, Latanya Maudlin, MD  promethazine (PHENERGAN) 12.5 MG tablet Take 1 tablet (12.5 mg total) every 8 (eight) hours as needed by mouth for nausea or vomiting. Patient not taking: Reported on 12/15/2017 07/26/17   Wallis Bamberg, PA-C  pseudoephedrine (SUDAFED) 60 MG tablet Take 1 tablet (60 mg total) 2 (two) times daily by mouth. Patient not taking: Reported on 12/15/2017 07/26/17   Wallis Bamberg, PA-C  sertraline (ZOLOFT) 50 MG tablet Take 1 tablet (50 mg total) daily by mouth. Patient not taking: Reported on 12/15/2017 07/26/17   Wallis Bamberg, PA-C    Family History Family History  Problem Relation Age of Onset  . Migraines Mother     Social History Social History   Tobacco Use  . Smoking status: Smoker, Current Status Unknown    Packs/day: 1.00    Years: 8.00    Pack years: 8.00    Types: Cigarettes    Last attempt to quit: 03/05/2016    Years since quitting: 1.7  . Smokeless tobacco: Never Used  . Tobacco comment: "really trying to quit"  Substance Use Topics  . Alcohol use: No    Alcohol/week: 0.0 oz  . Drug use: Yes    Types: Marijuana    Comment: daily     Allergies   Patient has no known allergies.   Review of Systems Review of Systems  ROS reviewed and all otherwise negative except for mentioned in HPI   Physical Exam Updated Vital Signs BP (!) 83/54 (BP Location: Left Arm) Comment: Pt sts she always has low BP. Pt mentating normally and ambulates w/o difficulty  Pulse (!) 59   Temp 97.9 F (36.6 C) (Oral)   Resp 20   Ht 5' 9.75" (1.772 m)   Wt 73.5 kg (162 lb)   SpO2 97%    BMI 23.41 kg/m  Vitals reviewed Physical Exam  Physical Examination: General appearance - alert, well appearing, and in no distress Mental status - alert, oriented to person, place, and time Eyes - no conjunctival injection, no scleral icterus Chest - clear to auscultation, no wheezes, rales or rhonchi, symmetric air entry Heart - normal rate, regular rhythm, normal S1, S2, no murmurs, rubs, clicks or gallops Back exam - no midline tenderness to palpation, no CVA tenderness, paraspinal ttp over right lumbar region Neurological - alert, oriented x 3, strength 5/5 in extremities x 4, sensation intact Extremities - peripheral pulses normal, no pedal edema, no clubbing or cyanosis Skin - normal coloration and turgor, no rashes   ED Treatments /  Results  Labs (all labs ordered are listed, but only abnormal results are displayed) Labs Reviewed  COMPREHENSIVE METABOLIC PANEL - Abnormal; Notable for the following components:      Result Value   CO2 21 (*)    Glucose, Bld 123 (*)    All other components within normal limits  CBC - Abnormal; Notable for the following components:   WBC 15.6 (*)    All other components within normal limits  URINALYSIS, ROUTINE W REFLEX MICROSCOPIC - Abnormal; Notable for the following components:   Hgb urine dipstick SMALL (*)    Ketones, ur 20 (*)    Squamous Epithelial / LPF 0-5 (*)    All other components within normal limits  LIPASE, BLOOD  I-STAT BETA HCG BLOOD, ED ([facility], [facility], [facility] ONLY)    EKG None  Radiology No results found.  Procedures Procedures (including critical care time)  Medications Ordered in ED Medications  ondansetron (ZOFRAN) injection 4 mg (4 mg Intravenous Given 12/15/17 1522)  sodium chloride 0.9 % bolus 1,000 mL (0 mLs Intravenous Stopped 12/15/17 1709)  HYDROmorphone (DILAUDID) injection 1 mg (1 mg Intravenous Given 12/15/17 1523)  ketorolac (TORADOL) 30 MG/ML injection 30 mg (30 mg Intravenous Given 12/15/17 1524)      Initial Impression / Assessment and Plan / ED Course  I have reviewed the triage vital signs and the nursing notes.  Pertinent labs & imaging results that were available during my care of the patient were reviewed by me and considered in my medical decision making (see chart for details).    5:29 PM pt is up and walking and states she is ready for discharge.    Patient presenting with right lower back pain with radiation down to her knee consistent with her prior sciatica pain.  She has full strength in her lower extremities no signs or symptoms of cauda equina.  No fever to suggest epidural abscess.  She feels improved after pain meds in the ED.  She is able to tolerate p.o. after antiemetics.  Patient discharged and advised to follow-up with her primary care doctor.  Final Clinical Impressions(s) / ED Diagnoses   Final diagnoses:  Sciatica of right side    ED Discharge Orders        Ordered    oxyCODONE-acetaminophen (PERCOCET/ROXICET) 5-325 MG tablet  Every 6 hours PRN     12/15/17 1726       Phillis Haggis, MD 12/15/17 1850

## 2018-01-23 ENCOUNTER — Emergency Department (HOSPITAL_COMMUNITY)
Admission: EM | Admit: 2018-01-23 | Discharge: 2018-01-23 | Discharge status: Against Medical Advice | Payer: No Typology Code available for payment source | Attending: Emergency Medicine | Admitting: Emergency Medicine

## 2018-01-23 ENCOUNTER — Other Ambulatory Visit: Payer: Self-pay

## 2018-01-23 ENCOUNTER — Encounter (HOSPITAL_COMMUNITY): Payer: Self-pay | Admitting: Emergency Medicine

## 2018-01-23 DIAGNOSIS — K0889 Other specified disorders of teeth and supporting structures: Secondary | ICD-10-CM | POA: Insufficient documentation

## 2018-01-23 NOTE — ED Triage Notes (Signed)
Called for pt no answer

## 2018-01-23 NOTE — ED Notes (Signed)
Bed: WTR5 Expected date:  Expected time:  Means of arrival:  Comments: 

## 2018-01-23 NOTE — ED Triage Notes (Signed)
Pt complaint of left upper dental pain for 2 weeks and right sciatica pain since end of March.

## 2018-01-23 NOTE — ED Triage Notes (Signed)
No answer

## 2018-02-01 ENCOUNTER — Encounter: Payer: Self-pay | Admitting: Family Medicine

## 2018-02-06 ENCOUNTER — Encounter: Payer: Self-pay | Admitting: Family Medicine

## 2018-02-12 ENCOUNTER — Encounter (HOSPITAL_COMMUNITY): Payer: Self-pay

## 2018-02-12 DIAGNOSIS — Z5321 Procedure and treatment not carried out due to patient leaving prior to being seen by health care provider: Secondary | ICD-10-CM | POA: Insufficient documentation

## 2018-02-12 DIAGNOSIS — M5430 Sciatica, unspecified side: Secondary | ICD-10-CM | POA: Insufficient documentation

## 2018-02-12 NOTE — ED Triage Notes (Signed)
Pt complains of sciatica and radiating foot pain for 24 hours, her foot is red but also has been to the beach this week

## 2018-02-13 ENCOUNTER — Emergency Department (HOSPITAL_COMMUNITY)
Admission: EM | Admit: 2018-02-13 | Discharge: 2018-02-13 | Disposition: A | Discharge status: Home / Self Care | Payer: No Typology Code available for payment source | Attending: Emergency Medicine | Admitting: Emergency Medicine

## 2018-02-13 NOTE — ED Notes (Signed)
Pt called from triage with no answer 

## 2018-02-13 NOTE — ED Notes (Signed)
Follow up call complete  Pt very up set w way treated,  States no one tried to help here till she could be seen  1130  02/13/18  s Jamica Woodyard rn

## 2018-07-03 ENCOUNTER — Other Ambulatory Visit: Payer: Self-pay | Admitting: Neurosurgery

## 2018-07-03 DIAGNOSIS — M5126 Other intervertebral disc displacement, lumbar region: Secondary | ICD-10-CM

## 2018-07-17 ENCOUNTER — Ambulatory Visit
Admission: RE | Admit: 2018-07-17 | Discharge: 2018-07-17 | Disposition: A | Discharge status: Home / Self Care | Payer: Medicaid Other | Source: Ambulatory Visit | Attending: Neurosurgery | Admitting: Neurosurgery

## 2018-07-17 DIAGNOSIS — M5126 Other intervertebral disc displacement, lumbar region: Secondary | ICD-10-CM

## 2018-07-17 MED ORDER — GADOBENATE DIMEGLUMINE 529 MG/ML IV SOLN
17.0000 mL | Freq: Once | INTRAVENOUS | Status: AC | PRN
  Administered 2018-07-17: 17 mL via INTRAVENOUS

## 2019-12-10 ENCOUNTER — Ambulatory Visit: Payer: Medicaid Other | Attending: Internal Medicine

## 2020-06-01 ENCOUNTER — Other Ambulatory Visit: Payer: Self-pay

## 2020-06-01 ENCOUNTER — Other Ambulatory Visit: Payer: Medicaid Other

## 2020-06-01 DIAGNOSIS — Z20822 Contact with and (suspected) exposure to covid-19: Secondary | ICD-10-CM

## 2020-06-03 LAB — NOVEL CORONAVIRUS, NAA: SARS-CoV-2, NAA: NOT DETECTED

## 2020-06-03 LAB — SARS-COV-2, NAA 2 DAY TAT

## 2020-06-14 ENCOUNTER — Other Ambulatory Visit: Payer: Medicaid Other

## 2020-06-14 DIAGNOSIS — Z20822 Contact with and (suspected) exposure to covid-19: Secondary | ICD-10-CM

## 2020-06-16 LAB — SARS-COV-2, NAA 2 DAY TAT

## 2020-06-16 LAB — NOVEL CORONAVIRUS, NAA: SARS-CoV-2, NAA: NOT DETECTED

## 2021-02-14 ENCOUNTER — Emergency Department (HOSPITAL_COMMUNITY)
Admission: EM | Admit: 2021-02-14 | Discharge: 2021-02-14 | Disposition: A | Discharge status: Home / Self Care | Payer: Medicaid Other | Attending: Emergency Medicine | Admitting: Emergency Medicine

## 2021-02-14 ENCOUNTER — Other Ambulatory Visit: Payer: Self-pay

## 2021-02-14 ENCOUNTER — Encounter (HOSPITAL_COMMUNITY): Payer: Self-pay | Admitting: Emergency Medicine

## 2021-02-14 DIAGNOSIS — Z23 Encounter for immunization: Secondary | ICD-10-CM | POA: Diagnosis not present

## 2021-02-14 DIAGNOSIS — S0990XA Unspecified injury of head, initial encounter: Secondary | ICD-10-CM | POA: Diagnosis present

## 2021-02-14 DIAGNOSIS — W01198A Fall on same level from slipping, tripping and stumbling with subsequent striking against other object, initial encounter: Secondary | ICD-10-CM | POA: Diagnosis not present

## 2021-02-14 DIAGNOSIS — F17210 Nicotine dependence, cigarettes, uncomplicated: Secondary | ICD-10-CM | POA: Insufficient documentation

## 2021-02-14 DIAGNOSIS — S0101XA Laceration without foreign body of scalp, initial encounter: Secondary | ICD-10-CM

## 2021-02-14 MED ORDER — TETANUS-DIPHTHERIA TOXOIDS TD 5-2 LFU IM INJ
0.5000 mL | INJECTION | Freq: Once | INTRAMUSCULAR | Status: AC
  Administered 2021-02-14: 0.5 mL via INTRAMUSCULAR
  Filled 2021-02-14: qty 0.5

## 2021-02-14 MED ORDER — ACETAMINOPHEN 325 MG PO TABS
650.0000 mg | ORAL_TABLET | Freq: Once | ORAL | Status: AC
  Administered 2021-02-14: 650 mg via ORAL
  Filled 2021-02-14: qty 2

## 2021-02-14 NOTE — Discharge Instructions (Addendum)
Staples out in 7 days.

## 2021-02-14 NOTE — ED Provider Notes (Signed)
[facility]-EMERGENCY DEPT Provider Note   CSN: [id number] Arrival date & time: 02/14/21  0900     History Chief Complaint  Patient presents with  . Head Laceration    Rhonda Key is a 35 y.o. female.  35 year old presents with laceration to her scalp after falling and striking the edge of a table today.  No LOC.  No neck discomfort.  Bleeding controlled with direct pressure.  Patient does not take any blood thinners.  Has not had any nausea or vomiting since the event.  Denies any numbness or tingling below her neck.        Past Medical History:  Diagnosis Date  . Acute back pain with sciatica   . Anxiety   . Depression   . Migraine   . Numbness    face  . Numbness and tingling in hands   . Numbness and tingling of both legs   . Numbness and tingling of right leg   . Ringing in ears   . Urinary incontinence     Patient Active Problem List   Diagnosis Date Noted  . 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 Contraception 01/24/2016  . Arnold-Chiari malformation (HCC) 01/24/2016  . Cephalalgia 01/13/2016    Past Surgical History:  Procedure Laterality Date  . BACK SURGERY    . DILATION AND CURETTAGE OF UTERUS    . LUMBAR LAMINECTOMY/DECOMPRESSION MICRODISCECTOMY  10/23/2012   Procedure: LUMBAR LAMINECTOMY/DECOMPRESSION MICRODISCECTOMY 1 LEVEL;  Surgeon: Tia Alert, MD;  Location: [facility];  Service: Neurosurgery;  Laterality: Right;  Right Lumbar five-Sacral one microdiscectomy  . LUMBAR LAMINECTOMY/DECOMPRESSION MICRODISCECTOMY N/A 02/05/2013   Procedure: REDO LUMBAR FIVE SACRAL ONE MICRODISKECTOMY, RIGHT;  Surgeon: Tia Alert, MD;  Location: [facility];  Service: Neurosurgery;  Laterality: N/A;  LUMBAR LAMINECTOMY/DECOMPRESSION MICRODISCECTOMY 1 LEVEL  . SUBOCCIPITAL CRANIECTOMY CERVICAL LAMINECTOMY N/A 03/10/2016   Procedure: Chiari Decompression with cervical laminectomy;  Surgeon: Maeola Harman, MD;  Location: [facility];   Service: Neurosurgery;  Laterality: N/A;  . WISDOM TOOTH EXTRACTION       OB History   No obstetric history on file.     Family History  Problem Relation Age of Onset  . Migraines Mother     Social History   Tobacco Use  . Smoking status: Smoker, Current Status Unknown    Packs/day: 1.00    Years: 8.00    Pack years: 8.00    Types: Cigarettes    Last attempt to quit: 03/05/2016    Years since quitting: 4.9  . Smokeless tobacco: Never Used  . Tobacco comment: "really trying to quit"  Substance Use Topics  . Alcohol use: No    Alcohol/week: 0.0 standard drinks  . Drug use: Yes    Types: Marijuana    Comment: daily    Home Medications Prior to Admission medications   Medication Sig Start Date End Date Taking? Authorizing Provider  acetaminophen (TYLENOL) 500 MG tablet Take 1,000 mg by mouth every 6 (six) hours as needed for mild pain.    [provider]  amoxicillin-clavulanate (AUGMENTIN) 500-125 MG tablet Take 1 tablet (500 mg total) 3 (three) times daily by mouth. Patient not taking: Reported on 12/15/2017 07/26/17   Wallis Bamberg, PA-C  cetirizine (ZYRTEC) 10 MG tablet Take 1 tablet (10 mg total) daily by mouth. Patient not taking: Reported on 12/15/2017 07/26/17   Wallis Bamberg, PA-C  diclofenac sodium (VOLTAREN) 1 % GEL Apply 2 g 4 (four) times  daily topically. Patient not taking: Reported on 12/15/2017 07/31/17   Tarry Kos, MD  etonogestrel (NEXPLANON) 68 MG IMPL implant 1 each by Subdermal route once. Reported on 03/10/2016    [provider]  fluticasone (FLONASE) 50 MCG/ACT nasal spray Place 2 sprays daily into both nostrils. 07/26/17   Wallis Bamberg, PA-C  ibuprofen (ADVIL,MOTRIN) 200 MG tablet Take 200-400 mg by mouth every 6 (six) hours as needed for mild pain or moderate pain.    [provider]  ondansetron (ZOFRAN) 4 MG tablet Take 1 tablet (4 mg total) by mouth every 8 (eight) hours as needed for nausea or vomiting. 03/20/17   Drema Dallas,  DO  oxyCODONE-acetaminophen (PERCOCET/ROXICET) 5-325 MG tablet Take 1-2 tablets by mouth every 6 (six) hours as needed for severe pain. 12/15/17   Mabe, Latanya Maudlin, MD  promethazine (PHENERGAN) 12.5 MG tablet Take 1 tablet (12.5 mg total) every 8 (eight) hours as needed by mouth for nausea or vomiting. Patient not taking: Reported on 12/15/2017 07/26/17   Wallis Bamberg, PA-C  promethazine (PHENERGAN) 25 MG suppository Place 25 mg rectally every 6 (six) hours as needed for nausea or vomiting.    [provider]  pseudoephedrine (SUDAFED) 60 MG tablet Take 1 tablet (60 mg total) 2 (two) times daily by mouth. Patient not taking: Reported on 12/15/2017 07/26/17   Wallis Bamberg, PA-C  sertraline (ZOLOFT) 50 MG tablet Take 1 tablet (50 mg total) daily by mouth. Patient not taking: Reported on 12/15/2017 07/26/17   Wallis Bamberg, PA-C    Allergies    Patient has no known allergies.  Review of Systems   Review of Systems  All other systems reviewed and are negative.   Physical Exam Updated Vital Signs BP 121/79 (BP Location: Right Arm)   Pulse 64   Temp 97.8 F (36.6 C) (Oral)   Resp 20   Ht 1.753 m (5\' 9" )   Wt 90.7 kg   LMP 02/07/2021   SpO2 98%   BMI 29.53 kg/m   Physical Exam Vitals and nursing note reviewed.  Constitutional:      General: She is not in acute distress.    Appearance: Normal appearance. She is well-developed. She is not toxic-appearing.  HENT:     Head:   Eyes:     General: Lids are normal.     Conjunctiva/sclera: Conjunctivae normal.     Pupils: Pupils are equal, round, and reactive to light.  Neck:     Thyroid: No thyroid mass.     Trachea: No tracheal deviation.  Cardiovascular:     Rate and Rhythm: Normal rate and regular rhythm.     Heart sounds: Normal heart sounds. No murmur heard. No gallop.   Pulmonary:     Effort: Pulmonary effort is normal. No respiratory distress.     Breath sounds: Normal breath sounds. No stridor. No decreased breath sounds,  wheezing, rhonchi or rales.  Abdominal:     General: Bowel sounds are normal. There is no distension.     Palpations: Abdomen is soft.     Tenderness: There is no abdominal tenderness. There is no rebound.  Musculoskeletal:        General: No tenderness. Normal range of motion.     Cervical back: Normal range of motion and neck supple.  Skin:    General: Skin is warm and dry.     Findings: No abrasion or rash.  Neurological:     General: No focal deficit present.  Mental Status: She is alert and oriented to person, place, and time.     GCS: GCS eye subscore is 4. GCS verbal subscore is 5. GCS motor subscore is 6.     Cranial Nerves: Cranial nerves are intact. No cranial nerve deficit.     Sensory: No sensory deficit.     Motor: Motor function is intact.  Psychiatric:        Speech: Speech normal.        Behavior: Behavior normal.     ED Results / Procedures / Treatments   Labs (all labs ordered are listed, but only abnormal results are displayed) Labs Reviewed - No data to display  EKG None  Radiology No results found.  Procedures Procedures   Medications Ordered in ED Medications  acetaminophen (TYLENOL) tablet 650 mg (has no administration in time range)    ED Course  I have reviewed the triage vital signs and the nursing notes.  Pertinent labs & imaging results that were available during my care of the patient were reviewed by me and considered in my medical decision making (see chart for details).    MDM Rules/Calculators/A&P                          LACERATION REPAIR Performed by: Toy Baker Authorized by: Toy Baker Consent: Verbal consent obtained. Risks and benefits: risks, benefits and alternatives were discussed Consent given by: patient Patient identity confirmed: provided demographic data Prepped and Draped in normal sterile fashion Wound explored  Laceration Location: Scalp  Laceration Length: 1 cm  No Foreign Bodies seen or  palpated  Anesthesia: local infiltration   Irrigation method: syringe Amount of cleaning: standard  Skin closure: Staples  Number of sutures: 1  Technique: Simple  Patient tolerance: Patient tolerated the procedure well with no immediate complications. Final Clinical Impression(s) / ED Diagnoses Final diagnoses:  None   Patient given Tylenol here and tetanus status was updated.  Return precautions given Rx / DC Orders ED Discharge Orders    None       Lorre Nick, MD 02/14/21 [phone]

## 2021-02-14 NOTE — ED Triage Notes (Signed)
Patient was sitting in a chair and the table was unstable, causing her to fall backward, striking her head on a grill.  Head laceration noted.
